# Patient Record
Sex: Male | Born: 1955 | Race: White | Hispanic: No | Marital: Married | Smoking: Former smoker
Health system: Southern US, Community
[De-identification: ages and names within clinical notes are randomized; demographics above are authoritative.]

## PROBLEM LIST (undated history)

## (undated) DIAGNOSIS — I10 Essential (primary) hypertension: Secondary | ICD-10-CM

## (undated) DIAGNOSIS — Z87442 Personal history of urinary calculi: Secondary | ICD-10-CM

## (undated) DIAGNOSIS — R51 Headache: Secondary | ICD-10-CM

## (undated) DIAGNOSIS — I2510 Atherosclerotic heart disease of native coronary artery without angina pectoris: Secondary | ICD-10-CM

## (undated) DIAGNOSIS — K219 Gastro-esophageal reflux disease without esophagitis: Secondary | ICD-10-CM

## (undated) DIAGNOSIS — R519 Headache, unspecified: Secondary | ICD-10-CM

## (undated) DIAGNOSIS — L409 Psoriasis, unspecified: Secondary | ICD-10-CM

## (undated) DIAGNOSIS — I214 Non-ST elevation (NSTEMI) myocardial infarction: Secondary | ICD-10-CM

## (undated) HISTORY — PX: FINGER SURGERY: SHX640

## (undated) HISTORY — PX: RHINOPLASTY: SUR1284

---

## 1999-10-20 ENCOUNTER — Emergency Department (HOSPITAL_COMMUNITY): Admission: EM | Admit: 1999-10-20 | Discharge: 1999-10-20 | Payer: Self-pay | Admitting: Emergency Medicine

## 2000-05-25 ENCOUNTER — Emergency Department (HOSPITAL_COMMUNITY): Admission: EM | Admit: 2000-05-25 | Discharge: 2000-05-25 | Payer: Self-pay | Admitting: Emergency Medicine

## 2007-05-26 ENCOUNTER — Emergency Department (HOSPITAL_COMMUNITY): Admission: EM | Admit: 2007-05-26 | Discharge: 2007-05-26 | Payer: Self-pay | Admitting: *Deleted

## 2011-07-14 LAB — DIFFERENTIAL
Basophils Absolute: 0.4 — ABNORMAL HIGH
Lymphs Abs: 3.1
Monocytes Absolute: 0.8 — ABNORMAL HIGH
Monocytes Relative: 6
Neutro Abs: 9.5 — ABNORMAL HIGH

## 2011-07-14 LAB — BASIC METABOLIC PANEL
CO2: 31
Chloride: 101
GFR calc non Af Amer: >60
Glucose, Bld: 122 — ABNORMAL HIGH
Potassium: 4.3
Sodium: 139

## 2011-07-14 LAB — CBC
HCT: 46.5
Hemoglobin: 16
MCHC: 34.4
MCV: 85.1
RDW: 12.9

## 2011-07-14 LAB — POCT CARDIAC MARKERS
Operator id: 3206
Troponin i, poc: <0.05

## 2014-12-09 DIAGNOSIS — I214 Non-ST elevation (NSTEMI) myocardial infarction: Secondary | ICD-10-CM

## 2014-12-09 HISTORY — DX: Non-ST elevation (NSTEMI) myocardial infarction: I21.4

## 2014-12-10 ENCOUNTER — Emergency Department (HOSPITAL_BASED_OUTPATIENT_CLINIC_OR_DEPARTMENT_OTHER): Payer: BLUE CROSS/BLUE SHIELD

## 2014-12-10 ENCOUNTER — Inpatient Hospital Stay (HOSPITAL_BASED_OUTPATIENT_CLINIC_OR_DEPARTMENT_OTHER)
Admission: EM | Admit: 2014-12-10 | Discharge: 2014-12-11 | DRG: 247 | Disposition: A | Discharge status: Home / Self Care | Payer: BLUE CROSS/BLUE SHIELD | Attending: Cardiology | Admitting: Cardiology

## 2014-12-10 ENCOUNTER — Encounter (HOSPITAL_COMMUNITY)
Admission: EM | Disposition: A | Discharge status: Home / Self Care | Payer: BLUE CROSS/BLUE SHIELD | Source: Home / Self Care | Attending: Cardiology

## 2014-12-10 ENCOUNTER — Encounter (HOSPITAL_BASED_OUTPATIENT_CLINIC_OR_DEPARTMENT_OTHER): Payer: Self-pay

## 2014-12-10 DIAGNOSIS — L409 Psoriasis, unspecified: Secondary | ICD-10-CM | POA: Diagnosis present

## 2014-12-10 DIAGNOSIS — I214 Non-ST elevation (NSTEMI) myocardial infarction: Secondary | ICD-10-CM | POA: Diagnosis present

## 2014-12-10 DIAGNOSIS — Z7902 Long term (current) use of antithrombotics/antiplatelets: Secondary | ICD-10-CM

## 2014-12-10 DIAGNOSIS — Z823 Family history of stroke: Secondary | ICD-10-CM

## 2014-12-10 DIAGNOSIS — Z87891 Personal history of nicotine dependence: Secondary | ICD-10-CM

## 2014-12-10 DIAGNOSIS — I2510 Atherosclerotic heart disease of native coronary artery without angina pectoris: Secondary | ICD-10-CM

## 2014-12-10 DIAGNOSIS — Z79899 Other long term (current) drug therapy: Secondary | ICD-10-CM | POA: Diagnosis not present

## 2014-12-10 DIAGNOSIS — Z91010 Allergy to peanuts: Secondary | ICD-10-CM | POA: Diagnosis not present

## 2014-12-10 DIAGNOSIS — I10 Essential (primary) hypertension: Secondary | ICD-10-CM | POA: Diagnosis present

## 2014-12-10 DIAGNOSIS — Z7982 Long term (current) use of aspirin: Secondary | ICD-10-CM

## 2014-12-10 DIAGNOSIS — I2582 Chronic total occlusion of coronary artery: Secondary | ICD-10-CM | POA: Diagnosis present

## 2014-12-10 DIAGNOSIS — E785 Hyperlipidemia, unspecified: Secondary | ICD-10-CM | POA: Diagnosis present

## 2014-12-10 DIAGNOSIS — R079 Chest pain, unspecified: Secondary | ICD-10-CM | POA: Diagnosis present

## 2014-12-10 DIAGNOSIS — Z955 Presence of coronary angioplasty implant and graft: Secondary | ICD-10-CM

## 2014-12-10 DIAGNOSIS — E669 Obesity, unspecified: Secondary | ICD-10-CM

## 2014-12-10 HISTORY — DX: Headache: R51

## 2014-12-10 HISTORY — DX: Gastro-esophageal reflux disease without esophagitis: K21.9

## 2014-12-10 HISTORY — DX: Essential (primary) hypertension: I10

## 2014-12-10 HISTORY — PX: PERCUTANEOUS CORONARY STENT INTERVENTION (PCI-S): SHX5485

## 2014-12-10 HISTORY — DX: Headache, unspecified: R51.9

## 2014-12-10 HISTORY — DX: Non-ST elevation (NSTEMI) myocardial infarction: I21.4

## 2014-12-10 HISTORY — DX: Psoriasis, unspecified: L40.9

## 2014-12-10 HISTORY — DX: Atherosclerotic heart disease of native coronary artery without angina pectoris: I25.10

## 2014-12-10 HISTORY — DX: Personal history of urinary calculi: Z87.442

## 2014-12-10 HISTORY — PX: LEFT HEART CATHETERIZATION WITH CORONARY ANGIOGRAM: SHX5451

## 2014-12-10 LAB — BASIC METABOLIC PANEL
ANION GAP: 6 (ref 5–15)
BUN: 18 mg/dL (ref 6–23)
CHLORIDE: 104 mmol/L (ref 96–112)
CO2: 29 mmol/L (ref 19–32)
Calcium: 8.3 mg/dL — ABNORMAL LOW (ref 8.4–10.5)
Creatinine, Ser: 1.05 mg/dL (ref 0.50–1.35)
GFR calc Af Amer: 90 mL/min — ABNORMAL LOW (ref >90)
GFR calc non Af Amer: 78 mL/min — ABNORMAL LOW (ref >90)
Glucose, Bld: 109 mg/dL — ABNORMAL HIGH (ref 70–99)
POTASSIUM: 3.8 mmol/L (ref 3.5–5.1)
Sodium: 139 mmol/L (ref 135–145)

## 2014-12-10 LAB — TROPONIN I
TROPONIN I: 0.15 ng/mL — AB (ref <0.031)
TROPONIN I: 2.45 ng/mL — AB (ref <0.031)
TROPONIN I: 7.51 ng/mL — AB (ref <0.031)
Troponin I: 1 ng/mL (ref <0.031)

## 2014-12-10 LAB — CBC WITH DIFFERENTIAL/PLATELET
Basophils Absolute: 0.1 10*3/uL (ref 0.0–0.1)
Basophils Relative: 1 % (ref 0–1)
EOS PCT: 5 % (ref 0–5)
Eosinophils Absolute: 0.6 10*3/uL (ref 0.0–0.7)
HEMATOCRIT: 42.1 % (ref 39.0–52.0)
Hemoglobin: 13.9 g/dL (ref 13.0–17.0)
Lymphocytes Relative: 19 % (ref 12–46)
Lymphs Abs: 2.2 10*3/uL (ref 0.7–4.0)
MCH: 28.2 pg (ref 26.0–34.0)
MCHC: 33 g/dL (ref 30.0–36.0)
MCV: 85.4 fL (ref 78.0–100.0)
MONOS PCT: 9 % (ref 3–12)
Monocytes Absolute: 1 10*3/uL (ref 0.1–1.0)
Neutro Abs: 7.6 10*3/uL (ref 1.7–7.7)
Neutrophils Relative %: 66 % (ref 43–77)
Platelets: 233 10*3/uL (ref 150–400)
RBC: 4.93 MIL/uL (ref 4.22–5.81)
RDW: 13.9 % (ref 11.5–15.5)
WBC: 11.4 10*3/uL — AB (ref 4.0–10.5)

## 2014-12-10 LAB — CBC
HCT: 40.3 % (ref 39.0–52.0)
HEMOGLOBIN: 13.4 g/dL (ref 13.0–17.0)
MCH: 28.4 pg (ref 26.0–34.0)
MCHC: 33.3 g/dL (ref 30.0–36.0)
MCV: 85.4 fL (ref 78.0–100.0)
PLATELETS: 244 10*3/uL (ref 150–400)
RBC: 4.72 MIL/uL (ref 4.22–5.81)
RDW: 13.7 % (ref 11.5–15.5)
WBC: 12.9 10*3/uL — ABNORMAL HIGH (ref 4.0–10.5)

## 2014-12-10 LAB — COMPREHENSIVE METABOLIC PANEL
ALBUMIN: 3.4 g/dL — AB (ref 3.5–5.2)
ALT: 17 U/L (ref 0–53)
AST: 20 U/L (ref 0–37)
Alkaline Phosphatase: 59 U/L (ref 39–117)
Anion gap: 9 (ref 5–15)
BUN: 23 mg/dL (ref 6–23)
CO2: 30 mmol/L (ref 19–32)
CREATININE: 1.19 mg/dL (ref 0.50–1.35)
Calcium: 8.1 mg/dL — ABNORMAL LOW (ref 8.4–10.5)
Chloride: 99 mmol/L (ref 96–112)
GFR calc non Af Amer: 67 mL/min — ABNORMAL LOW (ref >90)
GFR, EST AFRICAN AMERICAN: 77 mL/min — AB (ref >90)
Glucose, Bld: 145 mg/dL — ABNORMAL HIGH (ref 70–99)
Potassium: 3.4 mmol/L — ABNORMAL LOW (ref 3.5–5.1)
SODIUM: 138 mmol/L (ref 135–145)
TOTAL PROTEIN: 6.7 g/dL (ref 6.0–8.3)
Total Bilirubin: 0.4 mg/dL (ref 0.3–1.2)

## 2014-12-10 LAB — LIPID PANEL
CHOLESTEROL: 278 mg/dL — AB (ref 0–200)
HDL: 38 mg/dL — AB (ref >39)
LDL Cholesterol: 207 mg/dL — ABNORMAL HIGH (ref 0–99)
TRIGLYCERIDES: 164 mg/dL — AB (ref <150)
Total CHOL/HDL Ratio: 7.3 RATIO
VLDL: 33 mg/dL (ref 0–40)

## 2014-12-10 LAB — TSH: TSH: 4.439 u[IU]/mL (ref 0.350–4.500)

## 2014-12-10 LAB — PROTIME-INR
INR: 0.99 (ref 0.00–1.49)
Prothrombin Time: 13.2 seconds (ref 11.6–15.2)

## 2014-12-10 LAB — MAGNESIUM: Magnesium: 2.3 mg/dL (ref 1.5–2.5)

## 2014-12-10 LAB — APTT: aPTT: 45 seconds — ABNORMAL HIGH (ref 24–37)

## 2014-12-10 LAB — LIPASE, BLOOD: LIPASE: 51 U/L (ref 11–59)

## 2014-12-10 LAB — HEPARIN LEVEL (UNFRACTIONATED): Heparin Unfractionated: 0.11 IU/mL — ABNORMAL LOW (ref 0.30–0.70)

## 2014-12-10 LAB — BRAIN NATRIURETIC PEPTIDE
B NATRIURETIC PEPTIDE 5: 95.4 pg/mL (ref 0.0–100.0)
B Natriuretic Peptide: 131.9 pg/mL — ABNORMAL HIGH (ref 0.0–100.0)

## 2014-12-10 LAB — POCT ACTIVATED CLOTTING TIME: ACTIVATED CLOTTING TIME: 638 s

## 2014-12-10 LAB — D-DIMER, QUANTITATIVE: D-Dimer, Quant: 1.09 ug/mL-FEU — ABNORMAL HIGH (ref 0.00–0.48)

## 2014-12-10 SURGERY — LEFT HEART CATHETERIZATION WITH CORONARY ANGIOGRAM
Anesthesia: LOCAL

## 2014-12-10 MED ORDER — HYDRALAZINE HCL 20 MG/ML IJ SOLN
10.0000 mg | Freq: Once | INTRAMUSCULAR | Status: AC
  Administered 2014-12-10: 10 mg via INTRAVENOUS
  Filled 2014-12-10: qty 1

## 2014-12-10 MED ORDER — ASPIRIN 81 MG PO CHEW: 324.0000 mg | CHEWABLE_TABLET | ORAL | Status: DC

## 2014-12-10 MED ORDER — ACETAMINOPHEN 325 MG PO TABS
650.0000 mg | ORAL_TABLET | ORAL | Status: DC | PRN
  Administered 2014-12-11: 650 mg via ORAL
  Filled 2014-12-10: qty 2

## 2014-12-10 MED ORDER — ISOSORBIDE MONONITRATE ER 30 MG PO TB24
30.0000 mg | ORAL_TABLET | Freq: Every day | ORAL | Status: DC
  Administered 2014-12-10 – 2014-12-11 (×2): 30 mg via ORAL
  Filled 2014-12-10 (×2): qty 1

## 2014-12-10 MED ORDER — INFLUENZA VAC SPLIT QUAD 0.5 ML IM SUSY
0.5000 mL | PREFILLED_SYRINGE | INTRAMUSCULAR | Status: DC
  Filled 2014-12-10 (×2): qty 0.5

## 2014-12-10 MED ORDER — ONDANSETRON HCL 4 MG/2ML IJ SOLN: 4.0000 mg | Freq: Four times a day (QID) | INTRAMUSCULAR | Status: DC | PRN

## 2014-12-10 MED ORDER — TICAGRELOR 90 MG PO TABS
ORAL_TABLET | ORAL | Status: AC
  Filled 2014-12-10: qty 2

## 2014-12-10 MED ORDER — HEPARIN BOLUS VIA INFUSION
4000.0000 [IU] | Freq: Once | INTRAVENOUS | Status: AC
  Administered 2014-12-10: 4000 [IU] via INTRAVENOUS

## 2014-12-10 MED ORDER — ASPIRIN EC 81 MG PO TBEC: 81.0000 mg | DELAYED_RELEASE_TABLET | Freq: Every day | ORAL | Status: DC

## 2014-12-10 MED ORDER — ACETAMINOPHEN 325 MG PO TABS: 650.0000 mg | ORAL_TABLET | ORAL | Status: DC | PRN

## 2014-12-10 MED ORDER — POTASSIUM CHLORIDE CRYS ER 20 MEQ PO TBCR
40.0000 meq | EXTENDED_RELEASE_TABLET | Freq: Once | ORAL | Status: AC
  Administered 2014-12-10: 40 meq via ORAL
  Filled 2014-12-10 (×2): qty 2

## 2014-12-10 MED ORDER — HEPARIN (PORCINE) IN NACL 100-0.45 UNIT/ML-% IJ SOLN
INTRAMUSCULAR | Status: AC
  Administered 2014-12-10: 25000 [IU] via INTRAVENOUS
  Filled 2014-12-10: qty 250

## 2014-12-10 MED ORDER — LIDOCAINE HCL (PF) 1 % IJ SOLN
INTRAMUSCULAR | Status: AC
  Filled 2014-12-10: qty 30

## 2014-12-10 MED ORDER — TICAGRELOR 90 MG PO TABS
90.0000 mg | ORAL_TABLET | Freq: Two times a day (BID) | ORAL | Status: DC
  Administered 2014-12-11 (×2): 90 mg via ORAL
  Filled 2014-12-10 (×3): qty 1

## 2014-12-10 MED ORDER — ATORVASTATIN CALCIUM 80 MG PO TABS
80.0000 mg | ORAL_TABLET | Freq: Every day | ORAL | Status: DC
  Administered 2014-12-10: 80 mg via ORAL
  Filled 2014-12-10 (×4): qty 1

## 2014-12-10 MED ORDER — METOPROLOL TARTRATE 25 MG PO TABS: 25.0000 mg | ORAL_TABLET | Freq: Two times a day (BID) | ORAL | Status: DC

## 2014-12-10 MED ORDER — HEPARIN SODIUM (PORCINE) 1000 UNIT/ML IJ SOLN
INTRAMUSCULAR | Status: AC
  Filled 2014-12-10: qty 1

## 2014-12-10 MED ORDER — METOPROLOL TARTRATE 12.5 MG HALF TABLET
12.5000 mg | ORAL_TABLET | Freq: Two times a day (BID) | ORAL | Status: DC
  Filled 2014-12-10 (×2): qty 1

## 2014-12-10 MED ORDER — ZOLPIDEM TARTRATE 5 MG PO TABS
5.0000 mg | ORAL_TABLET | Freq: Every evening | ORAL | Status: DC | PRN
  Administered 2014-12-10: 5 mg via ORAL
  Filled 2014-12-10: qty 1

## 2014-12-10 MED ORDER — NITROGLYCERIN 0.4 MG SL SUBL: 0.4000 mg | SUBLINGUAL_TABLET | SUBLINGUAL | Status: DC | PRN

## 2014-12-10 MED ORDER — ATORVASTATIN CALCIUM 80 MG PO TABS: 80.0000 mg | ORAL_TABLET | Freq: Every day | ORAL | Status: DC

## 2014-12-10 MED ORDER — ASPIRIN EC 81 MG PO TBEC
81.0000 mg | DELAYED_RELEASE_TABLET | Freq: Every day | ORAL | Status: DC
  Administered 2014-12-11: 81 mg via ORAL
  Filled 2014-12-10: qty 1

## 2014-12-10 MED ORDER — NITROGLYCERIN IN D5W 200-5 MCG/ML-% IV SOLN
INTRAVENOUS | Status: AC
  Filled 2014-12-10: qty 250

## 2014-12-10 MED ORDER — FENTANYL CITRATE 0.05 MG/ML IJ SOLN
INTRAMUSCULAR | Status: AC
  Filled 2014-12-10: qty 2

## 2014-12-10 MED ORDER — SODIUM CHLORIDE 0.9 % IV SOLN
Freq: Once | INTRAVENOUS | Status: AC
  Administered 2014-12-10: 10 mL/h via INTRAVENOUS

## 2014-12-10 MED ORDER — HEPARIN (PORCINE) IN NACL 2-0.9 UNIT/ML-% IJ SOLN
INTRAMUSCULAR | Status: AC
  Filled 2014-12-10: qty 1000

## 2014-12-10 MED ORDER — VERAPAMIL HCL 2.5 MG/ML IV SOLN
INTRAVENOUS | Status: AC
  Filled 2014-12-10: qty 2

## 2014-12-10 MED ORDER — SODIUM CHLORIDE 0.9 % IV SOLN
0.2500 mg/kg/h | INTRAVENOUS | Status: DC
  Filled 2014-12-10: qty 250

## 2014-12-10 MED ORDER — ASPIRIN 81 MG PO CHEW
324.0000 mg | CHEWABLE_TABLET | Freq: Once | ORAL | Status: AC
  Administered 2014-12-10: 324 mg via ORAL
  Filled 2014-12-10: qty 4

## 2014-12-10 MED ORDER — MIDAZOLAM HCL 2 MG/2ML IJ SOLN
INTRAMUSCULAR | Status: AC
  Filled 2014-12-10: qty 2

## 2014-12-10 MED ORDER — METOPROLOL TARTRATE 25 MG PO TABS
25.0000 mg | ORAL_TABLET | Freq: Two times a day (BID) | ORAL | Status: DC
Start: 2014-12-10 — End: 2014-12-11
  Administered 2014-12-10 – 2014-12-11 (×3): 25 mg via ORAL
  Filled 2014-12-10 (×4): qty 1

## 2014-12-10 MED ORDER — SODIUM CHLORIDE 0.9 % IV SOLN: INTRAVENOUS | Status: DC

## 2014-12-10 MED ORDER — NITROGLYCERIN 0.4 MG SL SUBL
0.4000 mg | SUBLINGUAL_TABLET | SUBLINGUAL | Status: DC | PRN
  Administered 2014-12-10 (×3): 0.4 mg via SUBLINGUAL

## 2014-12-10 MED ORDER — NITROGLYCERIN 1 MG/10 ML FOR IR/CATH LAB
INTRA_ARTERIAL | Status: AC
  Filled 2014-12-10: qty 10

## 2014-12-10 MED ORDER — BIVALIRUDIN 250 MG IV SOLR
INTRAVENOUS | Status: AC
  Filled 2014-12-10: qty 250

## 2014-12-10 MED ORDER — ASPIRIN 300 MG RE SUPP: 300.0000 mg | RECTAL | Status: DC

## 2014-12-10 MED ORDER — HEPARIN (PORCINE) IN NACL 100-0.45 UNIT/ML-% IJ SOLN
1400.0000 [IU]/h | INTRAMUSCULAR | Status: DC
  Administered 2014-12-10: 25000 [IU] via INTRAVENOUS
  Filled 2014-12-10 (×2): qty 250

## 2014-12-10 NOTE — Progress Notes (Signed)
Unable to titrate patient off nitroglycerin infusion after imdur given. Dr. Tresa EndoKelly notified, patients sbp 180's on 10 mcg IV nitroglycerin. Instructed to keep on nitroglycerin infusion and titrate to systolic blood pressure, 100-140's ideally. Give metoprolol 2200 dose now. Further evaluation of blood pressure management can be assessed in the morning.

## 2014-12-10 NOTE — Interval H&P Note (Signed)
Cath Lab Visit (complete for each Cath Lab visit)  Clinical Evaluation Leading to the Procedure:   ACS: Yes.    Non-ACS:    Anginal Classification: CCS III  Anti-ischemic medical therapy: Minimal Therapy (1 class of medications)  Non-Invasive Test Results: No non-invasive testing performed  Prior CABG: No previous CABG      History and Physical Interval Note:  12/10/2014 11:57 AM  Elmon ElseStephen Sepulveda  has presented today for surgery, with the diagnosis of cp  The various methods of treatment have been discussed with the patient and family. After consideration of risks, benefits and other options for treatment, the patient has consented to  Procedure(s): LEFT HEART CATHETERIZATION WITH CORONARY ANGIOGRAM (N/A) as a surgical intervention .  The patient's history has been reviewed, patient examined, no change in status, stable for surgery.  I have reviewed the patient's chart and labs.  Questions were answered to the patient's satisfaction.     Tammra Pressman A

## 2014-12-10 NOTE — Progress Notes (Signed)
CRITICAL VALUE ALERT  Critical value received:yes  Date of notification: 12/10/14  Time of notification0819  Critical value read back: yes  Nurse who received alert: Wess Bottsesther Robley Matassa  MD notified (1st page):  Samara DeistKathryn, [place]  Time of first page: 0900  MD notified (2nd page):  Time of second page:  Responding MD: Danice GoltzKathryn T, PA  Time MD responded: 0900

## 2014-12-10 NOTE — CV Procedure (Addendum)
Matthew Walls is a 59 y.o. male   [id number]  [id number] LOCATION:  FACILITY: [place]  PHYSICIAN: Troy Sine, MD, [facility] 1956/08/30   DATE OF PROCEDURE:  12/10/2014     CARDIAC CATHETERIZATION/PERCUTANEOUS CORONARY INTERVENTION    HISTORY:    Mr. Matthew Walls is a 59 year old male without known prior CAD.  He has developed several weeks of chest discomfort.  Last evening he developed significant chest discomfort which did not improve.  He presented to the emergency room in the middle of the night.  Troponins are mildly positive suggestive of a NSTEMI.  He is brought to the catheterization laboratory for cardiac catheterization and possible percutaneous coronary intervention.   PROCEDURE:  Left heart catheterization via the radial approach: Coronary angiography, left ventriculography; PCI of totally occluded left circumflex coronary artery involving the AV groove circumflex and OM1 vessel  The patient was brought to the second floor [facility] cath lab in the fasting state. The patient was premedicated with Versed 2 mg and fentanyl 50 mcg. A right radial approach was utilized after an Allen's test verified adequate circulation. The right radial artery was punctured via the Seldinger technique, and a 6 [place] Glidesheath Slender was inserted without difficulty.  A radial cocktail consisting of Verapamil, IV nitroglycerin, and lidocaine was administered. Weight adjusted heparin was administered. A safety J wire was advanced into the ascending aorta. Diagnostic catheterization was done with a 5 [place] TIG 4.0 catheter. A 5 French pigtail catheter was used for left ventriculography.  With the demonstration of totally occluded left circumflex coronary artery in the AV groove with faint antegrade filling distally after a several centimeters of occlusion.  A decision was made to attempt a percutaneous coronary intervention.  Bivalirudin bolus plus infusion was administered.  Brilinta 180 mg was  administered orally.  A 6 French XB 3.5 guide with sideholes was needed for the intervention due to significant catheter dampening in the circumflex vessel without sideholes.  A Fielder XT wire was able to cross the total occlusion and advanced into the obtuse marginal vessel.  Once the total occlusion was opened, there was tandem stenoses in the obtuse marginal vessel.  Initial dilatation was done with a 2.015 mm Euphora balloon at 3, 6, 10, and 10 atm in the AV groove circumflex and tandem sites in the obtuse marginal vessel.  This was then upgraded to a 2.515 mm Euphora balloon and 3 inflations at 7, 9, and 10 atm were made.  A Resolute integrity 3.034 mm DES stent was then inserted to cover all 3 lesions commencing in the AV groove circumflex and extending into the mid obtuse marginal branch.  This was deployed at 12 and 13 atm.  An [place] Euphora 3.2515 mm balloon was used for post stent dilatation up to 3.23 mm.  Angiography confirmed an excellent angiographic result with brisk TIMI-3 TIMI-3 flow supplying a moderate-sized distal circumflex bed.  All catheters were removed from the patient.  A TR radial band was applied for hemostasis. The patient left the catheterization laboratory in stable condition.   HEMODYNAMICS:   Central Aorta: 160/102  Left Ventricle: 160/24  ANGIOGRAPHY:   The left main coronary artery was a very short vessel which immediately bifurcated into the LAD and left circumflex vessel  The LAD was a moderate size vessel that gave rise to a large first diagonal and first septal vessel.  There was mild 30% narrowing in the LAD after this large septal perforating artery.  The LAD extended to  the apex and was otherwise free of significant disease.  The left circumflex coronary artery had 30% ostial narrowing followed by 40-50% proximal stenosis.  There was any 70% stenosis before a diminutive branch.  The vessel was then occluded in the AV groove.  There was a skip leshion and  beyond this was faint filling with < 1TIMI flow of an obtuse marginal and distal AV groove circumflex.  The RCA was a moderate size vessel that a 20% proximal narrowing.  There are tandem 50 and 60% stenoses in the mid RCA.  There was 30% narrowing beyond the acute margin and 60% stenosis in the distal RCA extending into the PDA at the arch and of the continuation branch.  This was smooth and eccentric.  There was very faint collaterals to very small distal vessels of the circumflex.  Left ventriculography revealed low normal LV function with an ejection fraction of 50%. There was mild mid inferior hypocontractility.  Following PCI of the totally occluded AV groove circumflex with segmental lesions into the obtuse marginal vessel, treated with PTCA, and ultimate DES stenting with a Resolute 3.034 mm stent postdilated to 3.23 mm, the 70% stenosis was reduced to 0%, 100% occlusion to 0% and a 70% stenoses in the obtuse marginal vessel reduced to 0%.  Beyond the stent, the vessel tapered and gave rise to 3 additional branches.    IMPRESSION:  Non-ST segment elevation myocardial infarction secondary to total occlusion of the left circumflex vessel.  Three-vessel CAD with smooth 30% narrowing in the LAD, 30% ostial, 40-50% proximal followed by 70% AV groove circumflex stenosis prior to being totally occluded, and moderate RCA stenoses of 50-60% in the mid segment, 30% beyond the acute margin and 60% in the region of the PDA takeoff.  Successful percutaneous cardiac intervention of the left circumflex coronary artery with PTCA and DES stenting of the AV groove circumflex, and several sites in the obtuse 1 marginal vessel with insertion of a 3.034 mm Resolute DES stent postdilated 3.23 mm with the entire stented region reduced to 0%.  There is resumption of brisk TIMI-3 flow.   RECOMMENDATION:  Medical therapy for concomitant CAD.  Patient will continue with dual antiplatelet therapy for minimum of a  year and may benefit from long-term treatment beyond a year based on Pegasys trial data at areduce Brilinta dose.  Aggressive statin therapy will be implemented.   Troy Sine, MD, [facility] 12/10/2014 2:13 PM

## 2014-12-10 NOTE — ED Notes (Signed)
Attempted report 

## 2014-12-10 NOTE — ED Notes (Signed)
[facility], [facility] discussed, pt agreeable. Denies pain at this time, VSS/improved.

## 2014-12-10 NOTE — ED Provider Notes (Signed)
CSN: [id number]     Arrival date & time 12/10/14  0104 History   First MD Initiated Contact with Patient 12/10/14 0155     Chief Complaint  Patient presents with  . Chest Pain     (Consider location/radiation/quality/duration/timing/severity/associated sxs/prior Treatment) HPI Comments: Patient is a 59 year old male with no significant past medical history. He presents for evaluation of chest discomfort he has been experiencing intermittently for the past 2 weeks. He states it is worse when he lays back and after he eats. He denies any shortness of breath, nausea, diaphoresis, or radiation to the arm or jaw. This evening, his wife took his blood pressure and it was markedly elevated and he comes for evaluation of this.  Patient takes no medications and reports to me he has not seen a physician in over 5 years. He was followed by a physician at [facility], however she has since relocated and the patient has no primary.  Patient is a 59 y.o. male presenting with chest pain. The history is provided by the patient.  Chest Pain Pain location:  Substernal area Pain quality: tightness   Pain radiates to:  Does not radiate Pain severity:  Moderate Onset quality:  Sudden Duration:  2 weeks Timing:  Intermittent Progression:  Worsening Chronicity:  New Relieved by:  Rest Worsened by:  Certain positions and movement   Past Medical History  Diagnosis Date  . Psoriasis    History reviewed. No pertinent past surgical history. No family history on file. History  Substance Use Topics  . Smoking status: Never Smoker   . Smokeless tobacco: Not on file  . Alcohol Use: No    Review of Systems  Cardiovascular: Positive for chest pain.  All other systems reviewed and are negative.     Allergies  Review of patient's allergies indicates no known allergies.  Home Medications   Prior to Admission medications   Not on File   BP 185/106 mmHg  Pulse 115  Temp(Src) 98.5 F  (36.9 C) (Oral)  Resp 16  Ht  (1.753 m)  Wt 260 lb (117.935 kg)  BMI 38.38 kg/m2  SpO2 94% Physical Exam  Constitutional: He is oriented to person, place, and time. He appears well-developed and well-nourished. No distress.  HENT:  Head: Normocephalic and atraumatic.  Mouth/Throat: Oropharynx is clear and moist.  Neck: Normal range of motion. Neck supple.  Cardiovascular: Normal rate, regular rhythm and normal heart sounds.   No murmur heard. Pulmonary/Chest: Effort normal and breath sounds normal. No respiratory distress. He has no wheezes.  Abdominal: Soft. Bowel sounds are normal. He exhibits no distension. There is no tenderness.  Musculoskeletal: Normal range of motion. He exhibits edema.  There is 1+ edema of the bilateral lower extremities.  Neurological: He is alert and oriented to person, place, and time.  Skin: He is not diaphoretic.  Patient has a generalized psoriatic rash.  Nursing note and vitals reviewed.   ED Course  Procedures (including critical care time) Labs Review Labs Reviewed  CBC WITH DIFFERENTIAL/PLATELET - Abnormal; Notable for the following:    WBC 11.4 (*)    All other components within normal limits  COMPREHENSIVE METABOLIC PANEL  LIPASE, BLOOD  BRAIN NATRIURETIC PEPTIDE  TROPONIN I    Imaging Review Dg Chest 2 View  12/10/2014   CLINICAL DATA:  Chest pain and pressure for 2 weeks. Shortness of breath. Nonsmoker. History hypertension.  EXAM: CHEST  2 VIEW  COMPARISON:  05/26/2007  FINDINGS: Cardiac  enlargement. Mild to pulmonary vascular congestion. Interstitial changes in the lungs likely to represent interstitial edema. No focal consolidation. No blunting of costophrenic angles. No pneumothorax. Tortuous aorta.  IMPRESSION: Cardiac enlargement with pulmonary vascular congestion and interstitial edema.   Electronically Signed   By: Burman NievesWilliam  Stevens M.D.   On: 12/10/2014 01:33     EKG Interpretation   Date/Time:  Thursday December 10 2014  01:19:25 EST Ventricular Rate:  113 PR Interval:  134 QRS Duration: 86 QT Interval:  344 QTC Calculation: 471 R Axis:   48 Text Interpretation:  Sinus tachycardia Left ventricular hypertrophy with  repolarization abnormality Abnormal ECG Confirmed by DELOS  MD, Jasher Barkan  ([id number]) on 12/10/2014 2:09:14 AM      MDM   Final diagnoses:  None    Patient presents with complaints of tightness in his chest that has come and gone over the past 2 weeks. This occurs sometimes with exertion, sometimes not. His discomfort this evening was relieved with nitroglycerin. Workup reveals LVH with repolarization abnormality on his EKG along with a troponin of 0.15. I have high suspicion for a cardiac etiology and have discussed this with Dr. Leeann MustJacob Kelly who was on-call for cardiology. He agrees to accept the patient in transfer. He has recommended starting heparin and admission to telemetry.  CRITICAL CARE Performed by: Geoffery LyonseLo, Santhosh Gulino Total critical care time: 30 minutes Critical care time was exclusive of separately billable procedures and treating other patients. Critical care was necessary to treat or prevent imminent or life-threatening deterioration. Critical care was time spent personally by me on the following activities: development of treatment plan with patient and/or surrogate as well as nursing, discussions with consultants, evaluation of patient's response to treatment, examination of patient, obtaining history from patient or surrogate, ordering and performing treatments and interventions, ordering and review of laboratory studies, ordering and review of radiographic studies, pulse oximetry and re-evaluation of patient's condition.     Geoffery Lyonsouglas Brooke Payes, MD 12/10/14 [phone]

## 2014-12-10 NOTE — Progress Notes (Signed)
Patient Name: Matthew Walls Date of Encounter: 12/10/2014     Principal Problem:   NSTEMI (non-ST elevated myocardial infarction) Active Problems:   Chest pain    SUBJECTIVE  No CP or SOB currently. Understands that he may need a LHC today.   CURRENT MEDS . [START ON 12/11/2014] aspirin EC  81 mg Oral Daily  . atorvastatin  80 mg Oral q1800  . metoprolol tartrate  12.5 mg Oral BID    OBJECTIVE  Filed Vitals:   12/10/14 0315 12/10/14 0330 12/10/14 0345 12/10/14 0500  BP: 168/93 177/102 166/87 195/97  Pulse: 101 101 97 98  Temp:    97.7 F (36.5 C)  TempSrc:    Oral  Resp: Height:     (1.753 m)  Weight:    285 lb 0.9 oz (129.3 kg)  SpO2: 96% 96% 98% 100%   No intake or output data in the 24 hours ending 12/10/14 0750 Filed Weights   12/10/14 0114 12/10/14 0500  Weight: 260 lb (117.935 kg) 285 lb 0.9 oz (129.3 kg)    PHYSICAL EXAM  General: Pleasant, NAD. obese Neuro: Alert and oriented X 3. Moves all extremities spontaneously. Psych: Normal affect. HEENT:  Normal  Neck: Supple without bruits or JVD. Lungs:  Resp regular and unlabored, CTA. Heart: tachy. RRR no s3, s4, or murmurs. Abdomen: Soft, non-tender, non-distended, BS + x 4.  Extremities: No clubbing, cyanosis or edema. DP/PT/Radials 2+ and equal bilaterally.  Accessory Clinical Findings  CBC  Recent Labs  12/10/14 0156 12/10/14 0618  WBC 11.4* 12.9*  NEUTROABS 7.6  --   HGB 13.9 13.4  HCT 42.1 40.3  MCV 85.4 85.4  PLT 233 244   Basic Metabolic Panel  Recent Labs  12/10/14 0156  NA 138  K 3.4*  CL 99  CO2 30  GLUCOSE 145*  BUN 23  CREATININE 1.19  CALCIUM 8.1*   Liver Function Tests  Recent Labs  12/10/14 0156  AST 20  ALT 17  ALKPHOS 59  BILITOT 0.4  PROT 6.7  ALBUMIN 3.4*    Recent Labs  12/10/14 0156  LIPASE 51   Cardiac Enzymes  Recent Labs  12/10/14 0156  TROPONINI 0.15*    TELE  Sinus tachycardia.  Radiology/Studies  Dg Chest  2 View  12/10/2014   CLINICAL DATA:  Chest pain and pressure for 2 weeks. Shortness of breath. Nonsmoker. History hypertension.  EXAM: CHEST  2 VIEW  COMPARISON:  05/26/2007  FINDINGS: Cardiac enlargement. Mild to pulmonary vascular congestion. Interstitial changes in the lungs likely to represent interstitial edema. No focal consolidation. No blunting of costophrenic angles. No pneumothorax. Tortuous aorta.  IMPRESSION: Cardiac enlargement with pulmonary vascular congestion and interstitial edema.   Electronically Signed   By: Burman Nieves M.D.   On: 12/10/2014 01:33    ASSESSMENT AND PLAN  Mr. Tanvir Hipple is a 59 yo man with no past medical hx except psoriasis and obesity who presented to [facility] ED last night with intermittent chest pain that improved with SL NTG and found to have elevated troponin. He denies hx of HLD, DM or HTN and takes no medications. He hasn't seen a medical doctor in over 1 year and doesn't regularly follow with a PCP.  NSTEMI- troponin 0.15. ECG with no acute ST or TW changes. Continue to cycle enzymes.  -- No chest pain currently but his symptoms are very typical of ACS. Exertional and relieved by SL NTG.  --  Continue heparin gtt. Will plan for LHC today. Remain NPO -- Continue ASA, statin and BB  HLD- LDL 209. Placed on high dose statin.   Hyperglycemia- HgA1c pending  HTN- he has been very hypertensive since admission. Will placed on metoprolol 25mg  BID. This will help with tachycardia as well.   Sinus tachycardia- with sinus tach HR 98-120. Will add BB as above.  -- His father has a hx of DVT/PE. Patient is not SOB or had any recent travel, but will order a DDimer.   Obesity- diet and exercise.   Billy FischerSigned, THOMPSON, KATHRYN R PA-C  Pager [phone]  Personally seen and examined. Agree with above. NSTEMI - cath. Risks and benefits (stroke, MI , death, bleeding, renal) reviewed. Anxious. Radial. Willing to proceed. Increase metoprolol as needed. HTN noted.  ACE-I next.   Donato SchultzSKAINS, MARK, MD

## 2014-12-10 NOTE — Care Management Note (Addendum)
    Page 1 of 1   12/11/2014     11:25:53 AM CARE MANAGEMENT NOTE 12/11/2014  Patient:  Matthew Walls,Matthew Walls   Account Number:  [id number]  Date Initiated:  12/10/2014  Documentation initiated by:  Junius CreamerWELL,DEBBIE  Subjective/Objective Assessment:   adm w nstemi     Action/Plan:   lives w wife   Anticipated DC Date:  12/11/2014   Anticipated DC Plan:  HOME/SELF CARE      DC Planning Services  CM consult  PCP issues      Choice offered to / List presented to:             Status of service:   Medicare Important Message given?  NO (If response is "NO", the following Medicare IM given date fields will be blank) Date Medicare IM given:   Medicare IM given by:   Date Additional Medicare IM given:   Additional Medicare IM given by:    Discharge Disposition:  HOME/SELF CARE  Per UR Regulation:  Reviewed for med. necessity/level of care/duration of stay  If discussed at Long Length of Stay Meetings, dates discussed:    Comments:  12/11/14 Sidney AceJulie Akeen Ledyard, RN, BSN [phone] Pt s/p PCI; will dc on Brilinta.  Brilinta booklet given with 30 day free trial card and copay card.  per bcbs  online:  brilinta is a tier 3/ $50.00 co-pay at retail/ no auth required  patient can use: [facility], [facility], [facility], [facility], [facility], [facility], [facility], [facility], [facility]  Pt able to use copay card, and will be able to get med for $18/month.  Pt also needs PCP...states [facility] [place].  Appt made for Tues, March, 15 at 9:00am. Appt info put on AVS, and pt instructed on appt.

## 2014-12-10 NOTE — H&P (Signed)
Matthew Walls is an 59 y.o. male.    Chief Complaint: chest pain  Primary Cardiologist: new HPI: Matthew Walls is a 59 yo man with no PMH who present with intermittent chest pressure over the past two weeks walking up stairs. He characterizes the pain as pressure sensation, worse with exertion up stairs with some associated shortness of breath and sometimes worse laying back after eating. Tonight the pain came on again and it wasn't relieved leading to presentation. He received SL NTG with resolution of symptoms, troponin elevated and heparin initiated for NSTEMI leading to transfer to [facility]. No tobacco use.     Past Medical History  Diagnosis Date  . Psoriasis     History reviewed. No pertinent past surgical history.  No family history on file. Social History:  reports that he has never smoked. He does not have any smokeless tobacco history on file. He reports that he does not drink alcohol. His drug history is not on file. Family history of mother with CVA in 84s; father with unknown blood clots Allergies: No Known Allergies   (Not in a hospital admission)  Results for orders placed or performed during the hospital encounter of 12/10/14 (from the past 48 hour(s))  CBC with Differential     Status: Abnormal   Collection Time: 12/10/14  1:56 AM  Result Value Ref Range   WBC 11.4 (H) 4.0 - 10.5 K/uL   RBC 4.93 4.22 - 5.81 MIL/uL   Hemoglobin 13.9 13.0 - 17.0 g/dL   HCT 42.1 39.0 - 52.0 %   MCV 85.4 78.0 - 100.0 fL   MCH 28.2 26.0 - 34.0 pg   MCHC 33.0 30.0 - 36.0 g/dL   RDW 13.9 11.5 - 15.5 %   Platelets 233 150 - 400 K/uL   Neutrophils Relative % 66 43 - 77 %   Neutro Abs 7.6 1.7 - 7.7 K/uL   Lymphocytes Relative 19 12 - 46 %   Lymphs Abs 2.2 0.7 - 4.0 K/uL   Monocytes Relative 9 3 - 12 %   Monocytes Absolute 1.0 0.1 - 1.0 K/uL   Eosinophils Relative 5 0 - 5 %   Eosinophils Absolute 0.6 0.0 - 0.7 K/uL   Basophils Relative 1 0 - 1 %   Basophils Absolute 0.1 0.0 -  0.1 K/uL  Comprehensive metabolic panel     Status: Abnormal   Collection Time: 12/10/14  1:56 AM  Result Value Ref Range   Sodium 138 135 - 145 mmol/L   Potassium 3.4 (L) 3.5 - 5.1 mmol/L   Chloride 99 96 - 112 mmol/L   CO2 30 19 - 32 mmol/L   Glucose, Bld 145 (H) 70 - 99 mg/dL   BUN 23 6 - 23 mg/dL   Creatinine, Ser 1.19 0.50 - 1.35 mg/dL   Calcium 8.1 (L) 8.4 - 10.5 mg/dL   Total Protein 6.7 6.0 - 8.3 g/dL   Albumin 3.4 (L) 3.5 - 5.2 g/dL   AST 20 0 - 37 U/L   ALT 17 0 - 53 U/L   Alkaline Phosphatase 59 39 - 117 U/L   Total Bilirubin 0.4 0.3 - 1.2 mg/dL   GFR calc non Af Amer 67 (L) >90 mL/min   GFR calc Af Amer 77 (L) >90 mL/min    Comment: (NOTE) The eGFR has been calculated using the CKD EPI equation. This calculation has not been validated in all clinical situations. eGFR's persistently <90 mL/min signify possible Chronic Kidney Disease.  Anion gap 9 5 - 15  Lipase, blood     Status: None   Collection Time: 12/10/14  1:56 AM  Result Value Ref Range   Lipase 51 11 - 59 U/L  Brain natriuretic peptide     Status: None   Collection Time: 12/10/14  1:56 AM  Result Value Ref Range   B Natriuretic Peptide 95.4 0.0 - 100.0 pg/mL  Troponin I     Status: Abnormal   Collection Time: 12/10/14  1:56 AM  Result Value Ref Range   Troponin I 0.15 (H) <0.031 ng/mL    Comment:        PERSISTENTLY INCREASED TROPONIN VALUES IN THE RANGE OF 0.04-0.49 ng/mL CAN BE SEEN IN:       -UNSTABLE ANGINA       -CONGESTIVE HEART FAILURE       -MYOCARDITIS       -CHEST TRAUMA       -ARRYHTHMIAS       -LATE PRESENTING MYOCARDIAL INFARCTION       -COPD   CLINICAL FOLLOW-UP RECOMMENDED.    Dg Chest 2 View  12/10/2014   CLINICAL DATA:  Chest pain and pressure for 2 weeks. Shortness of breath. Nonsmoker. History hypertension.  EXAM: CHEST  2 VIEW  COMPARISON:  05/26/2007  FINDINGS: Cardiac enlargement. Mild to pulmonary vascular congestion. Interstitial changes in the lungs likely to  represent interstitial edema. No focal consolidation. No blunting of costophrenic angles. No pneumothorax. Tortuous aorta.  IMPRESSION: Cardiac enlargement with pulmonary vascular congestion and interstitial edema.   Electronically Signed   By: Lucienne Capers M.D.   On: 12/10/2014 01:33    Review of Systems  Constitutional: Negative for fever, chills and malaise/fatigue.  HENT: Negative for tinnitus.   Eyes: Negative for blurred vision, double vision and photophobia.  Respiratory: Positive for shortness of breath. Negative for sputum production.   Cardiovascular: Positive for chest pain. Negative for palpitations, claudication and leg swelling.  Gastrointestinal: Positive for heartburn. Negative for nausea, vomiting and abdominal pain.  Genitourinary: Negative for dysuria and urgency.  Musculoskeletal: Negative for myalgias and neck pain.  Neurological: Negative for dizziness, tingling, tremors and headaches.  Endo/Heme/Allergies: Negative for environmental allergies. Does not bruise/bleed easily.  Psychiatric/Behavioral: Negative for depression, suicidal ideas, hallucinations and substance abuse.    Blood pressure 166/87, pulse 97, temperature 98.5 F (36.9 C), temperature source Oral, resp. rate 20, height _0  (1.753 m), weight 117.935 kg (260 lb), SpO2 98 %. Physical Exam  Nursing note and vitals reviewed. Constitutional: He is oriented to person, place, and time. He appears well-developed and well-nourished. No distress.  HENT:  Head: Normocephalic and atraumatic.  Nose: Nose normal.  Mouth/Throat: Oropharynx is clear and moist. No oropharyngeal exudate.  Eyes: Conjunctivae and EOM are normal. Pupils are equal, round, and reactive to light. No scleral icterus.  Neck: Normal range of motion. Neck supple. No JVD present. No tracheal deviation present.  Cardiovascular: Normal rate, regular rhythm, normal heart sounds and intact distal pulses.  Exam reveals no gallop.   No murmur  heard. Respiratory: Effort normal and breath sounds normal. No respiratory distress. He has no wheezes. He has no rales.  GI: Soft. Bowel sounds are normal. He exhibits no distension. There is no tenderness. There is no rebound.  Musculoskeletal: Normal range of motion. He exhibits no edema or tenderness.  Neurological: He is alert and oriented to person, place, and time. No cranial nerve deficit. Coordination normal.  Skin: Skin is warm and  dry. No rash noted. He is not diaphoretic. No erythema.  Psychiatric: He has a normal mood and affect. His behavior is normal. Thought content normal.    Labs reviewed; trop 0.15, bun/cr 23/1.2, na 138, K 3.4, wbc 11, h/h 13/42, plt 233 EKG: sinus tachycardia, LVH with repolarization  Chest x-ray: mildly enlarged cardiac silhouette, ? Interstitial edema  Assessment/Plan Problem List Chest Pain Elevated Troponin/NSTEMI Hypertension  Matthew Walls is a 59 yo man with no PMH who presents with intermittent chest pain that improved with SL NTG and found to have elevated troponin. Differential diagnosis is musculoskeletal pain, esophageal spasm, GERD, pericarditis, ACS/NSTEMI among other etiologies. I favor a diagnosis of NSTEMI with plans for likely LHC in AM.  - NPO after MN - trend cardiac markers, heparin gtt - observation on telemetry - asa 81 mg, metoprolol 12.5 mg bid, atorvastatin 80 mg qHS now - hba1c, tsh, lipid panel, BNP    Landa Mullinax 12/10/2014, 4:28 AM

## 2014-12-10 NOTE — Progress Notes (Signed)
At 0500 BP noted to be 195/97 on dinamap. MD at bedside and gave order for 10 mg hydralazine once IV.   Pt resting comfortably and in no distress.   Will continue to monitor closely .  Matthew RoysMcGrath, Ellenora Talton R

## 2014-12-10 NOTE — ED Notes (Signed)
Pt c/o center chest pain since 2030 tonight, took mylanta with relief; states hx of same for over 2wks but only when laying down and goes away, this feels the same but won't go away; b/p elevated 220/120, no hx or no meds; denies SOB/nausea/radiating of pain/diaphoresis; no distress

## 2014-12-10 NOTE — ED Notes (Signed)
[facility] No changes. VSS/improved. Denies pain.

## 2014-12-10 NOTE — Progress Notes (Signed)
ANTICOAGULATION CONSULT NOTE - Initial Consult  Pharmacy Consult for Heparin Indication: chest pain/ACS  No Known Allergies  Patient Measurements: Height: 5\' 9"  (175.3 cm) Weight: 260 lb (117.935 kg) IBW/kg (Calculated) : 70.7 Heparin Dosing Weight: 95 kg  Vital Signs: Temp: 98.5 F (36.9 C) (03/10 0114) Temp Source: Oral (03/10 0114) BP: 166/87 mmHg (03/10 0345) Pulse Rate: 97 (03/10 0345)  Labs:  Recent Labs  12/10/14 0156  HGB 13.9  HCT 42.1  PLT 233  CREATININE 1.19  TROPONINI 0.15*    Estimated Creatinine Clearance: 87.8 mL/min (by C-G formula based on Cr of 1.19).   Medical History: Past Medical History  Diagnosis Date  . Psoriasis    Assessment: 59 y.o. male with chest pain for heparin   Goal of Therapy:  Heparin level 0.3-0.7 units/ml Monitor platelets by anticoagulation protocol: Yes   Plan:  Heparin 4000 units IV bolus, then start heparin 1400 units/hr Check heparin level in 6 hours.  Eddie CandleAbbott, Lennyn Gange Vernon 12/10/2014,4:33 AM

## 2014-12-10 NOTE — ED Notes (Signed)
Pt to xray, steady gait

## 2014-12-10 NOTE — ED Notes (Signed)
Dr. Delo at [facility].  

## 2014-12-11 ENCOUNTER — Encounter (HOSPITAL_COMMUNITY): Payer: Self-pay | Admitting: Physician Assistant

## 2014-12-11 DIAGNOSIS — I10 Essential (primary) hypertension: Secondary | ICD-10-CM

## 2014-12-11 DIAGNOSIS — E663 Overweight: Secondary | ICD-10-CM

## 2014-12-11 DIAGNOSIS — I2510 Atherosclerotic heart disease of native coronary artery without angina pectoris: Secondary | ICD-10-CM

## 2014-12-11 DIAGNOSIS — E785 Hyperlipidemia, unspecified: Secondary | ICD-10-CM

## 2014-12-11 LAB — BASIC METABOLIC PANEL
ANION GAP: 6 (ref 5–15)
BUN: 12 mg/dL (ref 6–23)
CALCIUM: 8.3 mg/dL — AB (ref 8.4–10.5)
CO2: 26 mmol/L (ref 19–32)
Chloride: 107 mmol/L (ref 96–112)
Creatinine, Ser: 1.12 mg/dL (ref 0.50–1.35)
GFR calc Af Amer: 83 mL/min — ABNORMAL LOW (ref >90)
GFR, EST NON AFRICAN AMERICAN: 72 mL/min — AB (ref >90)
Glucose, Bld: 101 mg/dL — ABNORMAL HIGH (ref 70–99)
Potassium: 4.5 mmol/L (ref 3.5–5.1)
Sodium: 139 mmol/L (ref 135–145)

## 2014-12-11 LAB — CBC
HEMATOCRIT: 37.6 % — AB (ref 39.0–52.0)
Hemoglobin: 12.3 g/dL — ABNORMAL LOW (ref 13.0–17.0)
MCH: 28.5 pg (ref 26.0–34.0)
MCHC: 32.7 g/dL (ref 30.0–36.0)
MCV: 87.2 fL (ref 78.0–100.0)
PLATELETS: 231 10*3/uL (ref 150–400)
RBC: 4.31 MIL/uL (ref 4.22–5.81)
RDW: 14 % (ref 11.5–15.5)
WBC: 14.4 10*3/uL — ABNORMAL HIGH (ref 4.0–10.5)

## 2014-12-11 LAB — HEMOGLOBIN A1C
HEMOGLOBIN A1C: 5.7 % — AB (ref 4.8–5.6)
MEAN PLASMA GLUCOSE: 117 mg/dL

## 2014-12-11 LAB — TROPONIN I: TROPONIN I: 5.91 ng/mL — AB (ref <0.031)

## 2014-12-11 MED ORDER — TICAGRELOR 90 MG PO TABS: 90.0000 mg | ORAL_TABLET | Freq: Two times a day (BID) | ORAL | Status: DC

## 2014-12-11 MED ORDER — ASPIRIN 81 MG PO TBEC: 81.0000 mg | DELAYED_RELEASE_TABLET | Freq: Every day | ORAL | Status: AC

## 2014-12-11 MED ORDER — AMLODIPINE BESYLATE 5 MG PO TABS: 5.0000 mg | ORAL_TABLET | Freq: Every day | ORAL | Status: DC

## 2014-12-11 MED ORDER — METOPROLOL TARTRATE 25 MG PO TABS: 25.0000 mg | ORAL_TABLET | Freq: Two times a day (BID) | ORAL | Status: DC

## 2014-12-11 MED ORDER — AMLODIPINE BESYLATE 5 MG PO TABS
5.0000 mg | ORAL_TABLET | Freq: Every day | ORAL | Status: DC
  Administered 2014-12-11: 12:00:00 5 mg via ORAL
  Filled 2014-12-11: qty 1

## 2014-12-11 MED ORDER — ATORVASTATIN CALCIUM 80 MG PO TABS: 80.0000 mg | ORAL_TABLET | Freq: Every day | ORAL | Status: DC

## 2014-12-11 MED ORDER — NITROGLYCERIN 0.4 MG SL SUBL: 0.4000 mg | SUBLINGUAL_TABLET | SUBLINGUAL | Status: DC | PRN

## 2014-12-11 MED ORDER — MAGNESIUM HYDROXIDE 400 MG/5ML PO SUSP
30.0000 mL | Freq: Every day | ORAL | Status: DC | PRN
Start: 2014-12-11 — End: 2014-12-11

## 2014-12-11 MED ORDER — ISOSORBIDE MONONITRATE ER 30 MG PO TB24: 30.0000 mg | ORAL_TABLET | Freq: Every day | ORAL | Status: DC

## 2014-12-11 MED FILL — Sodium Chloride IV Soln 0.9%: INTRAVENOUS | Qty: 50 | Status: AC

## 2014-12-11 NOTE — Discharge Instructions (Signed)
Acute Coronary Syndrome °Acute coronary syndrome (ACS) is an urgent problem in which the blood and oxygen supply to the heart is critically deficient. ACS requires hospitalization because one or more coronary arteries may be blocked. °ACS represents a range of conditions including: °· Previous angina that is now unstable, lasts longer, happens at rest, or is more intense. °· A heart attack, with heart muscle cell injury and death. °There are three vital coronary arteries that supply the heart muscle with blood and oxygen so that it can pump blood effectively. If blockages to these arteries develop, blood flow to the heart muscle is reduced. If the heart does not get enough blood, angina may occur as the first warning sign. °SYMPTOMS  °· The most common signs of angina include: °¨ Tightness or squeezing in the chest. °¨ Feeling of heaviness on the chest. °¨ Discomfort in the arms, neck, back, or jaw. °¨ Shortness of breath and nausea. °¨ Cold, wet skin. °· Angina is usually brought on by physical effort or excitement which increase the oxygen needs of the heart. These states increase the blood flow needs of the heart beyond what can be delivered. °· Other symptoms that are not as common include: °¨ Fatigue °¨ Unexplained feelings of nervousness or anxiety °¨ Weakness °¨ Diarrhea °· Sometimes, you may not have noticed any symptoms at all but still suffered a cardiac injury. °TREATMENT  °· Medicines to help discomfort may include nitroglycerin (nitro) in the form of tablets or a spray for rapid relief, or longer-acting forms such as cream, patches, or capsules. (Be aware that there are many side effects and possible interactions with other drugs). °· Other medicines may be used to help the heart pump better. °· Procedures to open blocked arteries including angioplasty or stent placement to keep the arteries open. °· Open heart surgery may be needed when there are many blockages or they are in critical locations that  are best treated with surgery. °HOME CARE INSTRUCTIONS  °· Do not use any tobacco products including cigarettes, chewing tobacco, or electronic cigarettes. °· Take one baby or adult aspirin daily, if your health care provider advises. This helps reduce the risk of a heart attack. °· It is very important that you follow the angina treatment prescribed by your health care provider. Make arrangements for proper follow-up care. °· Eat a heart healthy diet with salt and fat restrictions as advised. °· Regular exercise is good for you as long as it does not cause discomfort. Do not begin any new type of exercise until you check with your health care provider. °· If you are overweight, you should lose weight. °· Try to maintain normal blood lipid levels. °· Keep your blood pressure under control as recommended by your health care provider. °· You should tell your health care provider right away about any increase in the severity or frequency of your chest discomfort or angina attacks. When you have angina, you should stop what you are doing and sit down. This may bring relief in 3 to 5 minutes. If your health care provider has prescribed nitro, take it as directed. °· If your health care provider has given you a follow-up appointment, it is very important to keep that appointment. Not keeping the appointment could result in a chronic or permanent injury, pain, and disability. If there is any problem keeping the appointment, you must call back to this facility for assistance. °SEEK IMMEDIATE MEDICAL CARE IF:  °· You develop nausea, vomiting, or shortness   of breath. °· You feel faint, lightheaded, or pass out. °· Your chest discomfort gets worse. °· You are sweating or experience sudden profound fatigue. °· You do not get relief of your chest pain after 3 doses of nitro. °· Your discomfort lasts longer than 15 minutes. °MAKE SURE YOU:  °· Understand these instructions. °· Will watch your condition. °· Will get help right  away if you are not doing well or get worse. °· Take all medicines as directed by your health care provider. °Document Released: 09/18/2005 Document Revised: 09/23/2013 Document Reviewed: 01/20/2014 °[employer]® Patient Information ©2015 [employer]. This information is not intended to replace advice given to you by your health care provider. Make sure you discuss any questions you have with your health care provider. ° °No driving for 24 hours. No lifting over 5 lbs for 1 week. No sexual activity for 1 week. You may return to work on 12/14/2014. Keep procedure site clean & dry. If you notice increased pain, swelling, bleeding or pus, call/return!  You may shower, but no soaking baths/hot tubs/pools for 1 week.  ° ° °

## 2014-12-11 NOTE — Progress Notes (Signed)
TR BAND REMOVAL  LOCATION:    right radial  DEFLATED PER PROTOCOL:    Yes.    TIME BAND OFF / DRESSING APPLIED:    19:30   SITE UPON ARRIVAL:    Level 0  SITE AFTER BAND REMOVAL:    Level 0  REVERSE ALLEN'S TEST:     positive  CIRCULATION SENSATION AND MOVEMENT:    Within Normal Limits   Yes.    COMMENTS:   Pt tolerated removal of TR band without complications.  Will continue to monitor patient.

## 2014-12-11 NOTE — Progress Notes (Signed)
CARDIAC REHAB PHASE I   PRE:  Rate/Rhythm: 105 ST  BP:  Supine: 173/84  Sitting:    Standing:     SaO2:   MODE:  Ambulation: 1000 ft   POST:  Rate/Rhythm: 129 ST  BP:  Supine:   Sitting: 232/97 recheck 229/97  Standing:     SaO2:  0800-0930 Pt tolerated ambulation well without c/o of co or SOB. BP elevated before walk and was higher after walk. I rechecked it and it remained elevated with rest. Reported BP to nurse. HR also elevated after walk 129 ST. Completed MI and stent education with pt. He voices understanding. Pt agrees to Outpt CRP in [place], will send referral.   Melina CopaLisa Ayron Fillinger RN 12/11/2014 9:42 AM

## 2014-12-11 NOTE — Discharge Summary (Addendum)
Discharge Summary   Patient ID: Matthew Walls,  MRN: [id number], DOB/AGE: 57-Feb-1957 57 y.o.  Admit date: 12/10/2014 Discharge date: 12/11/2014  Primary Care Provider: PROVIDER NOT IN SYSTEM Primary Cardiologist: Dr. Anne Fu  Discharge Diagnoses Principal Problem:   NSTEMI (non-ST elevated myocardial infarction) Active Problems:   CAD (coronary artery disease)   Hyperlipidemia   Essential hypertension   Allergies Allergies  Allergen Reactions  . Peanut-Containing Drug Products Swelling    Procedures  Cardiac catheterization 12/10/2014  CARDIAC CATHETERIZATION/PERCUTANEOUS CORONARY INTERVENTION   IMPRESSION:  Non-ST segment elevation myocardial infarction secondary to total occlusion of the left circumflex vessel.  Three-vessel CAD with smooth 30% narrowing in the LAD, 30% ostial, 40-50% proximal followed by 70% AV groove circumflex stenosis prior to being totally occluded, and moderate RCA stenoses of 50-60% in the mid segment, 30% beyond the acute margin and 60% in the region of the PDA takeoff.  Successful percutaneous cardiac intervention of the left circumflex coronary artery with PTCA and DES stenting of the AV groove circumflex, and several sites in the obtuse 1 marginal vessel with insertion of a 3.034 mm Resolute DES stent postdilated 3.23 mm with the entire stented region reduced to 0%. There is resumption of brisk TIMI-3 flow.   RECOMMENDATION:  Medical therapy for concomitant CAD. Patient will continue with dual antiplatelet therapy for minimum of a year and may benefit from long-term treatment beyond a year based on Pegasys trial data at areduce Brilinta dose. Aggressive statin therapy will be implemented.    Hospital Course  The patient is a 59 year old male with past medical history of psoriasis, however no past cardiac history. He describes the pain as a pressure-like sensation and worse with exertion up stairs with some associated shortness of breath. He  presented to ED on 12/10/2014 after recurrent chest pain that did not go away. He eventually received a sublingual nitroglycerin with resolution of symptoms. On arrival to the ED, it was noted that his troponin was elevated. IV heparin was started and he was subsequently transferred to [facility].   He was admitted to cardiology service, overnight troponin trended up to 7.51. A1C 5.7. Lipid panel shows his cholesterol 278, LDL 207, HDL 38, triglyceride 164. He was started on high-dose statin medication and aspirin. He was seen the morning of 12/10/2014, a d-dimer was obtained for sinus tachycardia which came back mildly elevated at 1.07. Metoprolol 25 mg twice a day was added for hypertension. After discussing with the patient various options, patient underwent cardiac catheterization on 12/10/2014 which showed 30% narrowing in LAD, 40-50% proximal left circumflex stenosis followed by 70% AV groove stenosis prior to to being totally occluded, 50-60% mid RCA stenosis. The left circumflex occlusion was treated with PTCA and DES (3.034 mm Resolute DES stent postdilated 3.23 mm). Post cath, patient was placed on aspirin and Brilinta. PO Imdur was started as well.  Patient was seen in the morning of 12/11/2014, which time he denies any significant chest discomfort or shortness breath, he is deemed stable for discharge from cardiology perspective. His right radial cath site appears to be stable. I discussed with MD regarding his elevated d-dimer, suspicion for PE low at this time, will not pursue further testing as patient is currently CP free without SOB, LE edema or calf tenderness. His BP med likely will need further adjustment on followup.  Prior to discharge, patient BP went up to 200s after walking with cardiac rehab. Afterward, his BP continue to be around 180s, some improvement after  morning metoprolol. I have added amlodipine 5mg  daily to his medical regimen  Discharge Vitals Blood pressure 185/91,  pulse 95, temperature 98.1 F (36.7 C), temperature source Oral, resp. rate 18, height 5\' 9"  (1.753 m), weight 290 lb 2 oz (131.6 kg), SpO2 98 %.  Filed Weights   12/10/14 0114 12/10/14 0500 12/11/14 0400  Weight: 260 lb (117.935 kg) 285 lb 0.9 oz (129.3 kg) 290 lb 2 oz (131.6 kg)    Labs  CBC  Recent Labs  12/10/14 0156 12/10/14 0618 12/11/14 0352  WBC 11.4* 12.9* 14.4*  NEUTROABS 7.6  --   --   HGB 13.9 13.4 12.3*  HCT 42.1 40.3 37.6*  MCV 85.4 85.4 87.2  PLT 233 244 231   Basic Metabolic Panel  Recent Labs  12/10/14 0618 12/11/14 0352  NA 139 139  K 3.8 4.5  CL 104 107  CO2 29 26  GLUCOSE 109* 101*  BUN 18 12  CREATININE 1.05 1.12  CALCIUM 8.3* 8.3*  MG 2.3  --    Liver Function Tests  Recent Labs  12/10/14 0156  AST 20  ALT 17  ALKPHOS 59  BILITOT 0.4  PROT 6.7  ALBUMIN 3.4*    Recent Labs  12/10/14 0156  LIPASE 51   Cardiac Enzymes  Recent Labs  12/10/14 1050 12/10/14 1650 12/10/14 2325  TROPONINI 2.45* 7.51* 5.91*   D-Dimer  Recent Labs  12/10/14 1050  DDIMER 1.09*   Hemoglobin A1C  Recent Labs  12/10/14 0618  HGBA1C 5.7*   Fasting Lipid Panel  Recent Labs  12/10/14 0618  CHOL 278*  HDL 38*  LDLCALC 207*  TRIG 164*  CHOLHDL 7.3   Thyroid Function Tests  Recent Labs  12/10/14 0618  TSH 4.439    Disposition  Pt is being discharged home today in good condition.  Follow-up Plans & Appointments      Follow-up Information    Follow up with Jacolyn ReedyMichele Lenze, PA-C On 01/04/2015.   Specialty:  Cardiology   Why:  10:30am   Contact information:   [address] [place] [address] [phone]       Follow up with Maye HidesMILLER, RYAN DEAN, PA On 12/15/2014.   Specialty:  Physician Assistant   Why:  9:00AM please bring photo ID and insurance card to your appointment.     Contact information:   [address] [place] [address] [phone]       Discharge Medications    Medication List      TAKE these medications        amLODipine 5 MG tablet  Commonly known as:  NORVASC  Take 1 tablet (5 mg total) by mouth daily.     aspirin 81 MG EC tablet  Take 1 tablet (81 mg total) by mouth daily.  Notes to Patient:  NEW MEDICINE     atorvastatin 80 MG tablet  Commonly known as:  LIPITOR  Take 1 tablet (80 mg total) by mouth daily at 6 PM.  Notes to Patient:  NEW MEDICINE     isosorbide mononitrate 30 MG 24 hr tablet  Commonly known as:  IMDUR  Take 1 tablet (30 mg total) by mouth daily.  Notes to Patient:  NEW MEDICINE     metoprolol tartrate 25 MG tablet  Commonly known as:  LOPRESSOR  Take 1 tablet (25 mg total) by mouth 2 (two) times daily.  Notes to Patient:  NEW MEDICINE     nitroGLYCERIN 0.4 MG  SL tablet  Commonly known as:  NITROSTAT  Place 1 tablet (0.4 mg total) under the tongue every 5 (five) minutes as needed for chest pain.  Notes to Patient:  NEW MEDICINE     ticagrelor 90 MG Tabs tablet  Commonly known as:  BRILINTA  Take 1 tablet (90 mg total) by mouth 2 (two) times daily.  Notes to Patient:  NEW MEDICINE         Duration of Discharge Encounter   Greater than 30 minutes including physician time.  Ramond Dial PA-C Pager: [phone] 12/11/2014, 11:28 AM   I have examined the patient and reviewed assessment and plan and discussed with patient.  Agree with above as stated.   continue dual antiplatelet therapy. Please see my note from earlier today.  Right radial site intact.    Corky Crafts, MD

## 2014-12-11 NOTE — Progress Notes (Signed)
Patient Name: Matthew Walls Date of Encounter: 12/11/2014  Primary Cardiologist: new - Dr. Anne FuSkains   Principal Problem:   NSTEMI (non-ST elevated myocardial infarction) Active Problems:   CAD (coronary artery disease)   Hyperlipidemia   Essential hypertension    SUBJECTIVE  Denies any CP or SOB. Denies any recent LE swelling or calf tenderness.   CURRENT MEDS . aspirin EC  81 mg Oral Daily  . atorvastatin  80 mg Oral q1800  . Influenza vac split quadrivalent PF  0.5 mL Intramuscular Tomorrow-1000  . isosorbide mononitrate  30 mg Oral Daily  . metoprolol tartrate  25 mg Oral BID  . ticagrelor  90 mg Oral BID    OBJECTIVE  Filed Vitals:   12/11/14 0100 12/11/14 0200 12/11/14 0400 12/11/14 0515  BP: 127/76 138/76  151/52  Pulse: 84 82    Temp:   97.5 F (36.4 C)   TempSrc:   Oral   Resp:      Height:      Weight:   290 lb 2 oz (131.6 kg)   SpO2: 99% 97%      Intake/Output Summary (Last 24 hours) at 12/11/14 0718 Last data filed at 12/11/14 0600  Gross per 24 hour  Intake 2797.5 ml  Output   1000 ml  Net 1797.5 ml   Filed Weights   12/10/14 0114 12/10/14 0500 12/11/14 0400  Weight: 260 lb (117.935 kg) 285 lb 0.9 oz (129.3 kg) 290 lb 2 oz (131.6 kg)    PHYSICAL EXAM  General: Pleasant, NAD. Neuro: Alert and oriented X 3. Moves all extremities spontaneously. Psych: Normal affect. HEENT:  Normal  Neck: Supple without bruits or JVD. Lungs:  Resp regular and unlabored, CTA. Heart: RRR no s3, s4, or murmurs. R radial cath site stable.  Abdomen: Soft, non-tender, non-distended, BS + x 4.  Extremities: No clubbing, cyanosis or edema. DP/PT/Radials 2+ and equal bilaterally. Diffuse psoriatic changes through entire body  Accessory Clinical Findings  CBC  Recent Labs  12/10/14 0156 12/10/14 0618 12/11/14 0352  WBC 11.4* 12.9* 14.4*  NEUTROABS 7.6  --   --   HGB 13.9 13.4 12.3*  HCT 42.1 40.3 37.6*  MCV 85.4 85.4 87.2  PLT 233 244 231   Basic  Metabolic Panel  Recent Labs  12/10/14 0618 12/11/14 0352  NA 139 139  K 3.8 4.5  CL 104 107  CO2 29 26  GLUCOSE 109* 101*  BUN 18 12  CREATININE 1.05 1.12  CALCIUM 8.3* 8.3*  MG 2.3  --    Liver Function Tests  Recent Labs  12/10/14 0156  AST 20  ALT 17  ALKPHOS 59  BILITOT 0.4  PROT 6.7  ALBUMIN 3.4*    Recent Labs  12/10/14 0156  LIPASE 51   Cardiac Enzymes  Recent Labs  12/10/14 1050 12/10/14 1650 12/10/14 2325  TROPONINI 2.45* 7.51* 5.91*   BNP Invalid input(s): POCBNP D-Dimer  Recent Labs  12/10/14 1050  DDIMER 1.09*   Fasting Lipid Panel  Recent Labs  12/10/14 0618  CHOL 278*  HDL 38*  LDLCALC 207*  TRIG 164*  CHOLHDL 7.3   Thyroid Function Tests  Recent Labs  12/10/14 0618  TSH 4.439    TELE NSR with HR 70s    ECG  NSR with nonspecific T wave changes   Radiology/Studies  Dg Chest 2 View  12/10/2014   CLINICAL DATA:  Chest pain and pressure for 2 weeks. Shortness of breath. Nonsmoker. History hypertension.  EXAM: CHEST  2 VIEW  COMPARISON:  05/26/2007  FINDINGS: Cardiac enlargement. Mild to pulmonary vascular congestion. Interstitial changes in the lungs likely to represent interstitial edema. No focal consolidation. No blunting of costophrenic angles. No pneumothorax. Tortuous aorta.  IMPRESSION: Cardiac enlargement with pulmonary vascular congestion and interstitial edema.   Electronically Signed   By: Burman Nieves M.D.   On: 12/10/2014 01:33    ASSESSMENT AND PLAN  1. NSTEMI  - 3v dx with 30% narrowing in LAD, 70% stenosis in AV groove LCx prior to total occlusion treated with PTCA/DES (3.034 mm Resolute DES stent postdilated 3.23 mm), 50-60% mid RCA stenosis  - aggressive medical therapy. ASA,  metoprolol, Imdur and Brilinta.   - stable for discharge depend on whether to pursue further testing for abnormal d-dimer   2. CAD, newly diagnosed  3. Hyperlipidemia  4. Elevated d-dimer: will discuss with MD,  patient denies recurrent CP after cath, no SOB, no recent ipsilateral leg swelling or calf tenderness.  5. HTN  6. Obesity  7. Psoriasis  8. Former smoker  Medical illustrator, Azalee Course PA-C Pager: [phone]  I have examined the patient and reviewed assessment and plan and discussed with patient.  Agree with above as stated.  Spoke importance of diet control to reduce his cardiovascular risk. He had a regular soda by his bed. I talked to him about trying to minimize sugar intake to lose weight. Continue dual antiplatelet therapy for at least a year. Would not pursue workup for PE.  Mckade Gurka S.

## 2015-01-04 ENCOUNTER — Encounter: Payer: Self-pay | Admitting: Physician Assistant

## 2015-01-04 ENCOUNTER — Ambulatory Visit (INDEPENDENT_AMBULATORY_CARE_PROVIDER_SITE_OTHER): Payer: BLUE CROSS/BLUE SHIELD | Admitting: Physician Assistant

## 2015-01-04 ENCOUNTER — Encounter: Payer: Self-pay | Admitting: Cardiology

## 2015-01-04 VITALS — BP 154/90 | HR 62 | Ht 69.0 in | Wt 257.8 lb

## 2015-01-04 DIAGNOSIS — I214 Non-ST elevation (NSTEMI) myocardial infarction: Secondary | ICD-10-CM

## 2015-01-04 DIAGNOSIS — I10 Essential (primary) hypertension: Secondary | ICD-10-CM | POA: Diagnosis not present

## 2015-01-04 DIAGNOSIS — I2510 Atherosclerotic heart disease of native coronary artery without angina pectoris: Secondary | ICD-10-CM

## 2015-01-04 DIAGNOSIS — E785 Hyperlipidemia, unspecified: Secondary | ICD-10-CM

## 2015-01-04 MED ORDER — AMLODIPINE BESYLATE 10 MG PO TABS
10.0000 mg | ORAL_TABLET | Freq: Every day | ORAL | Status: DC
Start: 2015-01-04 — End: 2015-07-30

## 2015-01-04 NOTE — Progress Notes (Signed)
Cardiology Office Note   Date:  01/04/2015   ID:  Matthew Walls, DOB March 11, 1956, MRN [id number]  PCP:  Dr. Alycia Rossetti at [facility] primary care Cardiologist:  Donato Schultz, M.D.  Chief Complaint: Follow-up MI    History of Present Illness: Matthew Walls is a 59 y.o. male who presents for post hospital follow-up. He was admitted 12/10/14  with the non-STEMI treated with PTCA and DES of the AV groove circumflex in several sites in the OM1. He has residual 30% LAD, and moderate RCA stenosis of 50-60% mid segment and 60% and the region of the PDA takeoff. EF 50% with mild mid inferior hypo-contractility. He was sent home on aspirin and Brilinta. Amlodipine was added prior to discharge because of elevated blood pressures. Patient also has hyperlipidemia.  Patient comes in today feeling so much better. He no longer has dyspnea on exertion and has had no chest pain. He is walking 30 minutes a day and has lost 28 pounds by watching his food intake. He had an appointment with his primary care Dr. Alycia Rossetti at [facility] last week and had blood work drawn. He will be seen in 2 weeks for follow-up of the blood work.    Past Medical History  Diagnosis Date  . Psoriasis   . NSTEMI (non-ST elevated myocardial infarction) 12/09/2014  . Hypertension   . History of kidney stones   . GERD (gastroesophageal reflux disease)   . Headache   . CAD (coronary artery disease)     cath 12/10/2014 occluded LCx treated with PTCA and DES (3.034 mm Resolute DES stent postdilated 3.23 mm)    Past Surgical History  Procedure Laterality Date  . Rhinoplasty    . Finger surgery Right   . Left heart catheterization with coronary angiogram N/A 12/10/2014    Procedure: LEFT HEART CATHETERIZATION WITH CORONARY ANGIOGRAM;  Surgeon: Lennette Bihari, MD;  Location: [facility] CATH LAB;  Service: Cardiovascular;  Laterality: N/A;  . Percutaneous coronary stent intervention (pci-s)  12/10/2014    Procedure: PERCUTANEOUS CORONARY STENT INTERVENTION (PCI-S);   Surgeon: Lennette Bihari, MD;  Location: [facility] CATH LAB;  Service: Cardiovascular;;  Mid Circ Resolute [phone]     Current Outpatient Prescriptions  Medication Sig Dispense Refill  . amLODipine (NORVASC) 5 MG tablet Take 1 tablet (5 mg total) by mouth daily. 30 tablet 5  . aspirin EC 81 MG EC tablet Take 1 tablet (81 mg total) by mouth daily.    Marland Kitchen atorvastatin (LIPITOR) 80 MG tablet Take 1 tablet (80 mg total) by mouth daily at 6 PM. 30 tablet 11  . isosorbide mononitrate (IMDUR) 30 MG 24 hr tablet Take 1 tablet (30 mg total) by mouth daily. 30 tablet 5  . metoprolol tartrate (LOPRESSOR) 25 MG tablet Take 1 tablet (25 mg total) by mouth 2 (two) times daily. 60 tablet 5  . nitroGLYCERIN (NITROSTAT) 0.4 MG SL tablet Place 1 tablet (0.4 mg total) under the tongue every 5 (five) minutes as needed for chest pain. 25 tablet 3  . ticagrelor (BRILINTA) 90 MG TABS tablet Take 1 tablet (90 mg total) by mouth 2 (two) times daily. 180 tablet 3   No current facility-administered medications for this visit.    Allergies:   Peanut-containing drug products    Social History:  The patient  reports that he has quit smoking. He has never used smokeless tobacco. He reports that he does not drink alcohol or use illicit drugs.   History reviewed. No pertinent family history.  ROS:  Please see the history of present illness.   Otherwise, review of systems are positive for none.   All other systems are reviewed and negative.    PHYSICAL EXAM: BP 154/90 mmHg  Pulse 62  Ht  (1.753 m)  Wt 257 lb 12.8 oz (116.937 kg)  BMI 38.05 kg/m2 GEN: Obese, well developed, in no acute distress HEENT: normal Neck: no JVD, HJR, carotid bruits, or masses Cardiac: RRR; no gallop ,murmurs, rubs, thrill or heave,no edema,   Respiratory:  clear to auscultation bilaterally, normal work of breathing GI: soft, nontender, nondistended, + BS MS: no deformity or atrophy Extremities: Right radial artery at cath site without  hematoma or hemorrhage good radial and brachial pulses, lower extremities without cyanosis, clubbing, edema, good distal pulses bilaterally.  Skin: Psoriasis all over his back Neuro:  Strength and sensation are intact Psych: euthymic mood, full affect   EKG:  EKG is ordered today. The ekg ordered today demonstrates normal sinus rhythm with LVH early repolarization changes   Recent Labs: 12/10/2014: ALT 17; B Natriuretic Peptide 131.9*; Magnesium 2.3; TSH 4.439 12/11/2014: BUN 12; Creatinine 1.12; Hemoglobin 12.3*; Platelets 231; Potassium 4.5; Sodium 139    Lipid Panel    Component Value Date/Time   CHOL 278* 12/10/2014 0618   TRIG 164* 12/10/2014 0618   HDL 38* 12/10/2014 0618   CHOLHDL 7.3 12/10/2014 0618   VLDL 33 12/10/2014 0618   LDLCALC 207* 12/10/2014 0618      Wt Readings from Last 3 Encounters:  12/11/14 290 lb 2 oz (131.6 kg)      Other studies Reviewed: Additional studies/ records that were reviewed today include and review of the records demonstrates: Cardiac cath 12/10/14 Left ventriculography revealed low normal LV function with an ejection fraction of 50%. There was mild mid inferior hypocontractility.  Following PCI of the totally occluded AV groove circumflex with segmental lesions into the obtuse marginal vessel, treated with PTCA, and ultimate DES stenting with a Resolute 3.034 mm stent postdilated to 3.23 mm, the 70% stenosis was reduced to 0%, 100% occlusion to 0% and a 70% stenoses in the obtuse marginal vessel reduced to 0%.  Beyond the stent, the vessel tapered and gave rise to 3 additional branches.     IMPRESSION:  Non-ST segment elevation myocardial infarction secondary to total occlusion of the left circumflex vessel.  Three-vessel CAD with smooth 30% narrowing in the LAD, 30% ostial, 40-50% proximal followed by 70% AV groove circumflex stenosis prior to being totally occluded, and moderate RCA stenoses of 50-60% in the mid segment, 30% beyond the  acute margin and 60% in the region of the PDA takeoff.  Successful percutaneous cardiac intervention of the left circumflex coronary artery with PTCA and DES stenting of the AV groove circumflex, and several sites in the obtuse 1 marginal vessel with insertion of a 3.034 mm Resolute DES stent postdilated 3.23 mm with the entire stented region reduced to 0%.  There is resumption of brisk TIMI-3 flow.   RECOMMENDATION:  Medical therapy for concomitant CAD.  Patient will continue with dual antiplatelet therapy for minimum of a year and may benefit from long-term treatment beyond a year based on Pegasys trial data at areduce Brilinta dose.  Aggressive statin therapy will be implemented.    ASSESSMENT AND PLAN: CAD (coronary artery disease) Patient had non-STEMI treated with drug-eluting stent to the AV groove circumflex in several sites in the OM1. He has residual CAD as listed above to be treated medically.  Patient is doing well without symptoms and feels so much better. Continue Brilinta, aspirin, Imdur, Lopressor and Norvasc. Follow-up with Dr. Chales Abrahamsskeins in 2 months.   Essential hypertension Blood pressure remains elevated. He says it has been 135/80-154/90 at home. Increase amlodipine to 10 mg once daily. Continue weight loss program will help lower his blood pressure. 2 g sodium diet.   Hyperlipidemia Lipitor is new for him. He had blood work last week by Dr[facility]tiyan at [facility]. I've asked him to send these results to us. He believes he had a fasting lipid panel and LFTs. This needs to be verified.   Morbid obesity Patient has lost 28 pounds since hospitalization with exercise and diet. Recommend continued weight loss program.      Elson ClanSigned, Mozel Burdett, PA-C  01/04/2015 10:34 AM    [facility] [address], [place], [place]  [address] Phone: [phone](336) [phone]; Fax: [phone]

## 2015-01-04 NOTE — Assessment & Plan Note (Signed)
Lipitor is new for him. He had blood work last week by [facility] at [facility]. I've asked him to send these results to us. He believes he had a fasting lipid panel and LFTs. This needs to be verified.

## 2015-01-04 NOTE — Assessment & Plan Note (Signed)
Patient has lost 28 pounds since hospitalization with exercise and diet. Recommend continued weight loss program.

## 2015-01-04 NOTE — Assessment & Plan Note (Signed)
Blood pressure remains elevated. He says it has been 135/80-154/90 at home. Increase amlodipine to 10 mg once daily. Continue weight loss program will help lower his blood pressure. 2 g sodium diet.

## 2015-01-04 NOTE — Assessment & Plan Note (Signed)
Patient had non-STEMI treated with drug-eluting stent to the AV groove circumflex in several sites in the OM1. He has residual CAD as listed above to be treated medically. Patient is doing well without symptoms and feels so much better. Continue Brilinta, aspirin, Imdur, Lopressor and Norvasc. Follow-up with Dr. Chales Abrahamsskeins in 2 months.

## 2015-01-04 NOTE — Patient Instructions (Addendum)
Your physician has recommended you make the following change in your medication: Increase Norvasc ( 10 mg ) daily, sent in to your pharmacy.   Your physician recommends that you schedule a follow-up appointment with Dr. Anne FuSkains.  Please fax Dr. Anne FuSkains all recent lab results from Dr. Coral Elseyan's office.

## 2015-01-05 ENCOUNTER — Encounter: Payer: Self-pay | Admitting: Physician Assistant

## 2015-03-05 ENCOUNTER — Ambulatory Visit (INDEPENDENT_AMBULATORY_CARE_PROVIDER_SITE_OTHER): Payer: BLUE CROSS/BLUE SHIELD | Admitting: Cardiology

## 2015-03-05 ENCOUNTER — Encounter: Payer: Self-pay | Admitting: Cardiology

## 2015-03-05 ENCOUNTER — Telehealth: Payer: Self-pay | Admitting: Cardiology

## 2015-03-05 VITALS — BP 124/78 | HR 64 | Ht 69.0 in | Wt 233.2 lb

## 2015-03-05 DIAGNOSIS — I2583 Coronary atherosclerosis due to lipid rich plaque: Principal | ICD-10-CM

## 2015-03-05 DIAGNOSIS — I10 Essential (primary) hypertension: Secondary | ICD-10-CM | POA: Diagnosis not present

## 2015-03-05 DIAGNOSIS — I214 Non-ST elevation (NSTEMI) myocardial infarction: Secondary | ICD-10-CM

## 2015-03-05 DIAGNOSIS — I2510 Atherosclerotic heart disease of native coronary artery without angina pectoris: Secondary | ICD-10-CM | POA: Diagnosis not present

## 2015-03-05 DIAGNOSIS — E785 Hyperlipidemia, unspecified: Secondary | ICD-10-CM

## 2015-03-05 LAB — LIPID PANEL
CHOL/HDL RATIO: 3
Cholesterol: 84 mg/dL (ref 0–200)
HDL: 26.7 mg/dL — AB (ref >39.00)
LDL Cholesterol: 38 mg/dL (ref 0–99)
NONHDL: 57.3
TRIGLYCERIDES: 97 mg/dL (ref 0.0–149.0)
VLDL: 19.4 mg/dL (ref 0.0–40.0)

## 2015-03-05 LAB — ALT: ALT: 19 U/L (ref 0–53)

## 2015-03-05 MED ORDER — AMLODIPINE BESYLATE 10 MG PO TABS: 10.0000 mg | ORAL_TABLET | Freq: Every day | ORAL | Status: DC

## 2015-03-05 NOTE — Progress Notes (Signed)
Cardiology Office Note   Date:  03/05/2015   ID:  Matthew Walls, DOB [birth year], MRN [id number]  PCP:  PROVIDER NOT IN SYSTEM  Cardiologist:   Donato Schultz, MD       History of Present Illness: Matthew Walls is a 59 y.o. male who presents for follow-up of coronary artery disease status post non-ST elevation myocardial infarction in March 2016 with drug-eluting stent placement to circumflex artery here for follow-up.  -Excellent job with weight loss, approximately 40 pounds. Been walking 45 minutes a day. No chest pain, no shortness of breath. He has not had chest pain since the initial hospital day.  He has been compliant with his medications. No bleeding. No myalgias. Original cholesterol LDL was 207.    Past Medical History  Diagnosis Date  . Psoriasis   . NSTEMI (non-ST elevated myocardial infarction) 12/09/2014  . Hypertension   . History of kidney stones   . GERD (gastroesophageal reflux disease)   . Headache   . CAD (coronary artery disease)     cath 12/10/2014 occluded LCx treated with PTCA and DES (3.034 mm Resolute DES stent postdilated 3.23 mm)    Past Surgical History  Procedure Laterality Date  . Rhinoplasty    . Finger surgery Right   . Left heart catheterization with coronary angiogram N/A 12/10/2014    Procedure: LEFT HEART CATHETERIZATION WITH CORONARY ANGIOGRAM;  Surgeon: Lennette Bihari, MD;  Location: [facility] CATH LAB;  Service: Cardiovascular;  Laterality: N/A;  . Percutaneous coronary stent intervention (pci-s)  12/10/2014    Procedure: PERCUTANEOUS CORONARY STENT INTERVENTION (PCI-S);  Surgeon: Lennette Bihari, MD;  Location: [facility] CATH LAB;  Service: Cardiovascular;;  Mid Circ Resolute [phone]     Current Outpatient Prescriptions  Medication Sig Dispense Refill  . amLODipine (NORVASC) 5 MG tablet Take 1 tablet by mouth daily.  4  . aspirin EC 81 MG EC tablet Take 1 tablet (81 mg total) by mouth daily.    Marland Kitchen atorvastatin (LIPITOR) 80 MG tablet Take 1 tablet (80 mg  total) by mouth daily at 6 PM. 30 tablet 11  . isosorbide mononitrate (IMDUR) 30 MG 24 hr tablet Take 1 tablet (30 mg total) by mouth daily. 30 tablet 5  . metoprolol tartrate (LOPRESSOR) 25 MG tablet Take 1 tablet (25 mg total) by mouth 2 (two) times daily. 60 tablet 5  . nitroGLYCERIN (NITROSTAT) 0.4 MG SL tablet Place 1 tablet (0.4 mg total) under the tongue every 5 (five) minutes as needed for chest pain. (Patient taking differently: Place 0.4 mg under the tongue every 5 (five) minutes as needed for chest pain ((MAX of 3 doses)). ) 25 tablet 3  . ticagrelor (BRILINTA) 90 MG TABS tablet Take 1 tablet (90 mg total) by mouth 2 (two) times daily. 180 tablet 3  . Vitamin D, Ergocalciferol, (DRISDOL) 50000 UNITS CAPS capsule   11   No current facility-administered medications for this visit.    Allergies:   Peanut-containing drug products    Social History:  The patient  reports that he has quit smoking. He has never used smokeless tobacco. He reports that he does not drink alcohol or use illicit drugs.   Family History:  The patient's family history includes CVA in his mother.    ROS:  Please see the history of present illness.   Otherwise, review of systems are positive for none.   All other systems are reviewed and negative.    PHYSICAL EXAM: VS:  BP 124/78  mmHg  Pulse 64  Ht 5\' 9"  (1.753 m)  Wt 233 lb 3.2 oz (105.779 kg)  BMI 34.42 kg/m2 , BMI Body mass index is 34.42 kg/(m^2). GEN: Well nourished, well developed, in no acute distress HEENT: normal Neck: no JVD, carotid bruits, or masses Cardiac: RRR; no murmurs, rubs, or gallops,no edema  Respiratory:  clear to auscultation bilaterally, normal work of breathing GI: soft, nontender, nondistended, + BS overweight  MS: no deformity or atrophy Skin: warm and dry, no rash Neuro:  Strength and sensation are intact Psych: euthymic mood, full affect   EKG:  EKG is not ordered today.    Recent Labs: 12/10/2014: ALT 17; B  Natriuretic Peptide 131.9*; Magnesium 2.3; TSH 4.439 12/11/2014: BUN 12; Creatinine 1.12; Hemoglobin 12.3*; Platelets 231; Potassium 4.5; Sodium 139    Lipid Panel    Component Value Date/Time   CHOL 278* 12/10/2014 0618   TRIG 164* 12/10/2014 0618   HDL 38* 12/10/2014 0618   CHOLHDL 7.3 12/10/2014 0618   VLDL 33 12/10/2014 0618   LDLCALC 207* 12/10/2014 0618      Wt Readings from Last 3 Encounters:  03/05/15 233 lb 3.2 oz (105.779 kg)  01/04/15 257 lb 12.8 oz (116.937 kg)  12/11/14 290 lb 2 oz (131.6 kg)      Other studies Reviewed: Additional studies/ records that were reviewed today inclhospital records reviewed, lab work reviewed, catheterization report reviewed. Review of the above records demonstrates: As above   ASSESSMENT AND PLAN:  1.  non-ST elevation myocardial infarction-DES obtuse marginal. Understands DAPT for one year. has residual mild nonobstructive coronary artery disease in other vessels.  2. Hyperlipidemia-LDL 207 originally. On atorvastatin 80 mg. Checking lipid profile and ALT today. LDL goal 70.  3. Morbid obesity-excellent job with weight loss, 40 pounds. Continue walking.  Essential hypertension-continue with current medications. Low-dose beta blocker noted. He will call us with amlodipine dosage.   medicines are reviewed at length with the patient today.  The patient does not have concerns regarding medicines.  The following changes have been made:  no change  Labs/ tests ordered today include: lipids, ALT.     Orders Placed This Encounter  Procedures  . Lipid panel  . ALT     Disposition:   FU with Skains in 6 months  Signed, Donato SchultzSKAINS, MARK, MD  03/05/2015 8:37 AM    [facility] [address], [place], [place]  [address] Phone: [phone]([phone]; Fax: [phone]

## 2015-03-05 NOTE — Telephone Encounter (Signed)
New message      Pt was seen earlier today.  He is on amlodipine 10mg  daily not 5mg .

## 2015-03-05 NOTE — Addendum Note (Signed)
Addended by: Tonita PhoenixBOWDEN, Mac Dowdell K on: 03/05/2015 08:39 AM   Modules accepted: Orders

## 2015-03-05 NOTE — Telephone Encounter (Signed)
noted 

## 2015-03-05 NOTE — Patient Instructions (Signed)
Medication Instructions:  Your physician recommends that you continue on your current medications as directed. Please refer to the Current Medication list given to you today. Please call back and let us know if you are taking Amlodipine 5 mg a day or 10 mg a day.   Labwork: Lipid and ALT today.  Follow-Up: Follow up in 6 months with Dr. Anne FuSkains.  You will receive a letter in the mail 2 months before you are due.  Please call us when you receive this letter to schedule your follow up appointment.  Thank you for choosing [facility]!!

## 2015-03-08 ENCOUNTER — Other Ambulatory Visit: Payer: Self-pay | Admitting: *Deleted

## 2015-03-08 DIAGNOSIS — E785 Hyperlipidemia, unspecified: Secondary | ICD-10-CM

## 2015-03-09 ENCOUNTER — Telehealth: Payer: Self-pay | Admitting: Cardiology

## 2015-03-09 MED ORDER — ATORVASTATIN CALCIUM 40 MG PO TABS: 40.0000 mg | ORAL_TABLET | Freq: Every day | ORAL | Status: DC

## 2015-03-09 NOTE — Telephone Encounter (Signed)
New Message       Pt calling stating that his Generic for Lipitor was changed from 80 mg to 40 mg and he told Pam that he would just cut the pills in half. Pt states that it is more difficult than it sounds to cut them in half and he would like a new prescription called in to the [facility] on [address] [place]. Please call back and advise when this has been done.

## 2015-03-09 NOTE — Telephone Encounter (Addendum)
Left message to call back. Sent change for his Lipitor to 40 mg and sent to pharmacy of choice.

## 2015-03-10 NOTE — Telephone Encounter (Signed)
Patient aware.

## 2015-03-10 NOTE — Telephone Encounter (Signed)
Left second message.

## 2015-05-31 ENCOUNTER — Other Ambulatory Visit: Payer: Self-pay | Admitting: Physician Assistant

## 2015-05-31 NOTE — Telephone Encounter (Signed)
Please review for refill, [place] pt. 

## 2015-06-08 ENCOUNTER — Other Ambulatory Visit: Payer: BLUE CROSS/BLUE SHIELD

## 2015-07-30 ENCOUNTER — Other Ambulatory Visit: Payer: Self-pay | Admitting: Physician Assistant

## 2015-09-01 ENCOUNTER — Ambulatory Visit (INDEPENDENT_AMBULATORY_CARE_PROVIDER_SITE_OTHER): Payer: BLUE CROSS/BLUE SHIELD | Admitting: Cardiology

## 2015-09-01 ENCOUNTER — Encounter: Payer: Self-pay | Admitting: Cardiology

## 2015-09-01 VITALS — BP 124/68 | HR 61 | Ht 69.0 in | Wt 198.8 lb

## 2015-09-01 DIAGNOSIS — I10 Essential (primary) hypertension: Secondary | ICD-10-CM

## 2015-09-01 DIAGNOSIS — I252 Old myocardial infarction: Secondary | ICD-10-CM

## 2015-09-01 DIAGNOSIS — E663 Overweight: Secondary | ICD-10-CM

## 2015-09-01 DIAGNOSIS — I2510 Atherosclerotic heart disease of native coronary artery without angina pectoris: Secondary | ICD-10-CM

## 2015-09-01 DIAGNOSIS — I2583 Coronary atherosclerosis due to lipid rich plaque: Secondary | ICD-10-CM

## 2015-09-01 DIAGNOSIS — E785 Hyperlipidemia, unspecified: Secondary | ICD-10-CM

## 2015-09-01 NOTE — Patient Instructions (Addendum)
Medication Instructions:  Your physician recommends that you continue on your current medications as directed. Please refer to the Current Medication list given to you today. 1- You can stop Brilinta in March 2017 and continue your aspirin 81 mg by mouth.  Labwork: NONE  Testing/Procedures: NONE  Follow-Up: Your physician wants you to follow-up in: 6 months with Dr. Anne FuSkains. You will receive a reminder letter in the mail two months in advance. If you don't receive a letter, please call our office to schedule the follow-up appointment.  If you need a refill on your cardiac medications before your next appointment, please call your pharmacy.

## 2015-09-01 NOTE — Progress Notes (Signed)
Cardiology Office Note   Date:  09/01/2015   ID:  Matthew Walls, DOB [birth year], MRN [id number]  PCP:  PROVIDER NOT IN SYSTEM  Cardiologist:   Donato Schultz, MD       History of Present Illness: Matthew Walls is a 59 y.o. male who presents for follow-up of coronary artery disease status post non-ST elevation myocardial infarction in March 2016 with drug-eluting stent placement to circumflex artery.  -Excellent job with weight loss, approximately 70 pounds. Been walking 45 minutes a day. Using dietary suggestions from his hospitalization/cardiac rehabilitation. No chest pain, no shortness of breath. He has not had chest pain since the initial hospital day.  He has been compliant with his medications. No bleeding. No myalgias. Original cholesterol LDL was 207. Last check 38. Continuing with high intensity statin.    Past Medical History  Diagnosis Date  . Psoriasis   . NSTEMI (non-ST elevated myocardial infarction) (HCC) 12/09/2014  . Hypertension   . History of kidney stones   . GERD (gastroesophageal reflux disease)   . Headache   . CAD (coronary artery disease)     cath 12/10/2014 occluded LCx treated with PTCA and DES (3.034 mm Resolute DES stent postdilated 3.23 mm)    Past Surgical History  Procedure Laterality Date  . Rhinoplasty    . Finger surgery Right   . Left heart catheterization with coronary angiogram N/A 12/10/2014    Procedure: LEFT HEART CATHETERIZATION WITH CORONARY ANGIOGRAM;  Surgeon: Lennette Bihari, MD;  Location: [facility] CATH LAB;  Service: Cardiovascular;  Laterality: N/A;  . Percutaneous coronary stent intervention (pci-s)  12/10/2014    Procedure: PERCUTANEOUS CORONARY STENT INTERVENTION (PCI-S);  Surgeon: Lennette Bihari, MD;  Location: [facility] CATH LAB;  Service: Cardiovascular;;  Mid Circ Resolute [id number]     Current Outpatient Prescriptions  Medication Sig Dispense Refill  . amLODipine (NORVASC) 10 MG tablet TAKE 1 TABLET BY MOUTH DAILY 30 tablet 5  .  aspirin EC 81 MG EC tablet Take 1 tablet (81 mg total) by mouth daily.    Marland Kitchen atorvastatin (LIPITOR) 40 MG tablet Take 1 tablet (40 mg total) by mouth daily at 6 PM. 30 tablet 11  . isosorbide mononitrate (IMDUR) 30 MG 24 hr tablet TAKE 1 TABLET(30 MG) BY MOUTH DAILY 30 tablet 6  . metoprolol tartrate (LOPRESSOR) 25 MG tablet TAKE 1 TABLET(25 MG) BY MOUTH TWICE DAILY 60 tablet 6  . nitroGLYCERIN (NITROSTAT) 0.4 MG SL tablet Place 0.4 mg under the tongue every 5 (five) minutes as needed for chest pain (x 3 doses daily).    . ticagrelor (BRILINTA) 90 MG TABS tablet Take 1 tablet (90 mg total) by mouth 2 (two) times daily. 180 tablet 3  . Vitamin D, Ergocalciferol, (DRISDOL) 50000 UNITS CAPS capsule Take 50,000 Units by mouth every 7 (seven) days.   11   No current facility-administered medications for this visit.    Allergies:   Peanut-containing drug products    Social History:  The patient  reports that he has quit smoking. He has never used smokeless tobacco. He reports that he does not drink alcohol or use illicit drugs.   Family History:  The patient's family history includes CVA in his mother.    ROS:  Please see the history of present illness.   Otherwise, review of systems are positive for none.   All other systems are reviewed and negative.    PHYSICAL EXAM: VS:  BP 124/68 mmHg  Pulse 61  Ht  5\' 9"  (1.753 m)  Wt 198 lb 12.8 oz (90.175 kg)  BMI 29.34 kg/m2  SpO2 97% , BMI Body mass index is 29.34 kg/(m^2). GEN: Well nourished, well developed, in no acute distress HEENT: normal Neck: no JVD, carotid bruits, or masses Cardiac: RRR; no murmurs, rubs, or gallops,no edema  Respiratory:  clear to auscultation bilaterally, normal work of breathing GI: soft, nontender, nondistended, + BS overweight  MS: no deformity or atrophy Skin: warm and dry, no rash Neuro:  Strength and sensation are intact Psych: euthymic mood, full affect   EKG:  EKG is not ordered today.    Recent  Labs: 12/10/2014: B Natriuretic Peptide 131.9*; Magnesium 2.3; TSH 4.439 12/11/2014: BUN 12; Creatinine, Ser 1.12; Hemoglobin 12.3*; Platelets 231; Potassium 4.5; Sodium 139 03/05/2015: ALT 19    Lipid Panel    Component Value Date/Time   CHOL 84 03/05/2015 0840   TRIG 97.0 03/05/2015 0840   HDL 26.70* 03/05/2015 0840   CHOLHDL 3 03/05/2015 0840   VLDL 19.4 03/05/2015 0840   LDLCALC 38 03/05/2015 0840      Wt Readings from Last 3 Encounters:  09/01/15 198 lb 12.8 oz (90.175 kg)  03/05/15 233 lb 3.2 oz (105.779 kg)  01/04/15 257 lb 12.8 oz (116.937 kg)      Other studies Reviewed: Additional studies/ records that were reviewed today inclhospital records reviewed, lab work reviewed, catheterization report reviewed. Review of the above records demonstrates: As above   ASSESSMENT AND PLAN:  1. Non-ST elevation myocardial infarction-DES obtuse marginal 12/10/14. Understands DAPT for one year. Has residual mild nonobstructive coronary artery disease in other vessels. Overall doing very well with no anginal symptoms.  2. Hyperlipidemia-LDL 207 originally. Now 38. On atorvastatin 40 mg.  3. Overweight-excellent job with weight loss, 70 pounds. Was 270 pounds. Continue walking. Fantastic job. Congratulated.  4. Essential hypertension-continue with current medications. Low-dose beta blocker noted. He will call us with amlodipine dosage.   medicines are reviewed at length with the patient today.  The patient does not have concerns regarding medicines.  The following changes have been made:  no change  Labs/ tests ordered today include: lipids, ALT.     No orders of the defined types were placed in this encounter.     Disposition:   FU with Atina Feeley in 6 months  Signed, Donato SchultzSKAINS, Shanan Mcmiller, MD  09/01/2015 8:51 AM    [facility] [address], [place], [place]  [address] Phone: [phone]; Fax: [phone]

## 2015-12-16 ENCOUNTER — Other Ambulatory Visit: Payer: Self-pay | Admitting: Cardiology

## 2016-01-14 ENCOUNTER — Other Ambulatory Visit: Payer: Self-pay | Admitting: Cardiology

## 2016-03-01 ENCOUNTER — Ambulatory Visit (INDEPENDENT_AMBULATORY_CARE_PROVIDER_SITE_OTHER): Payer: BLUE CROSS/BLUE SHIELD | Admitting: Cardiology

## 2016-03-01 ENCOUNTER — Encounter: Payer: Self-pay | Admitting: Cardiology

## 2016-03-01 VITALS — BP 130/82 | HR 53 | Ht 69.0 in | Wt 204.4 lb

## 2016-03-01 DIAGNOSIS — I252 Old myocardial infarction: Secondary | ICD-10-CM

## 2016-03-01 DIAGNOSIS — I2510 Atherosclerotic heart disease of native coronary artery without angina pectoris: Secondary | ICD-10-CM

## 2016-03-01 DIAGNOSIS — E663 Overweight: Secondary | ICD-10-CM

## 2016-03-01 DIAGNOSIS — I2583 Coronary atherosclerosis due to lipid rich plaque: Principal | ICD-10-CM

## 2016-03-01 DIAGNOSIS — I10 Essential (primary) hypertension: Secondary | ICD-10-CM

## 2016-03-01 NOTE — Patient Instructions (Addendum)
**Note De-Identified Rc Amison Obfuscation** Medication Instructions:  Stop taking Isosorbide-all other medications remain the same.  Labwork: None  Testing/Procedures: None  Follow-Up: Your physician wants you to follow-up in: 1 year. You will receive a reminder letter in the mail two months in advance. If you don't receive a letter, please call our office to schedule the follow-up appointment.     If you need a refill on your cardiac medications before your next appointment, please call your pharmacy.

## 2016-03-01 NOTE — Progress Notes (Signed)
Cardiology Office Note   Date:  03/01/2016   ID:  Matthew Walls, DOB 03/26/1956, MRN [id number]  PCP:  PROVIDER NOT IN SYSTEM  Cardiologist:   Donato Schultz, MD       History of Present Illness: Matthew Walls is a 60 y.o. male who presents for follow-up of coronary artery disease status post non-ST elevation myocardial infarction in March 2016 with drug-eluting stent placement to circumflex artery.  -Excellent job with weight loss, approximately 70 pounds. Been walking 45 minutes a day. Using dietary suggestions from his hospitalization/cardiac rehabilitation. No chest pain, no shortness of breath. He has not had chest pain since the initial hospital day.  He has been compliant with his medications. No bleeding. No myalgias. Original cholesterol LDL was 207. Last check 38. Continuing with high intensity statin.    Past Medical History  Diagnosis Date  . Psoriasis   . NSTEMI (non-ST elevated myocardial infarction) (HCC) 12/09/2014  . Hypertension   . History of kidney stones   . GERD (gastroesophageal reflux disease)   . Headache   . CAD (coronary artery disease)     cath 12/10/2014 occluded LCx treated with PTCA and DES (3.034 mm Resolute DES stent postdilated 3.23 mm)    Past Surgical History  Procedure Laterality Date  . Rhinoplasty    . Finger surgery Right   . Left heart catheterization with coronary angiogram N/A 12/10/2014    Procedure: LEFT HEART CATHETERIZATION WITH CORONARY ANGIOGRAM;  Surgeon: Lennette Bihari, MD;  Location: [facility] CATH LAB;  Service: Cardiovascular;  Laterality: N/A;  . Percutaneous coronary stent intervention (pci-s)  12/10/2014    Procedure: PERCUTANEOUS CORONARY STENT INTERVENTION (PCI-S);  Surgeon: Lennette Bihari, MD;  Location: [facility] CATH LAB;  Service: Cardiovascular;;  Mid Circ Resolute [id number]     Current Outpatient Prescriptions  Medication Sig Dispense Refill  . amLODipine (NORVASC) 10 MG tablet TAKE 1 TABLET BY MOUTH DAILY 30 tablet 6  . aspirin  EC 81 MG EC tablet Take 1 tablet (81 mg total) by mouth daily.    Marland Kitchen atorvastatin (LIPITOR) 40 MG tablet Take 1 tablet (40 mg total) by mouth daily at 6 PM. 30 tablet 11  . ferrous sulfate 325 (65 FE) MG tablet Take 1 tablet by mouth daily.  3  . metoprolol tartrate (LOPRESSOR) 25 MG tablet TAKE 1 TABLET(25 MG) BY MOUTH TWICE DAILY 60 tablet 5  . nitroGLYCERIN (NITROSTAT) 0.4 MG SL tablet Place 0.4 mg under the tongue every 5 (five) minutes as needed for chest pain (x 3 doses daily).    . Vitamin D, Ergocalciferol, (DRISDOL) 50000 UNITS CAPS capsule Take 50,000 Units by mouth every 7 (seven) days.   11   No current facility-administered medications for this visit.    Allergies:   Peanut-containing drug products    Social History:  The patient  reports that he has quit smoking. He has never used smokeless tobacco. He reports that he does not drink alcohol or use illicit drugs.   Family History:  The patient's family history includes CVA in his mother.    ROS:  Please see the history of present illness.   Otherwise, review of systems are positive for none.   All other systems are reviewed and negative.    PHYSICAL EXAM: VS:  BP 130/82 mmHg  Pulse 53  Ht  (1.753 m)  Wt 204 lb 6.4 oz (92.715 kg)  BMI 30.17 kg/m2 , BMI Body mass index is 30.17 kg/(m^2). GEN: Well  nourished, well developed, in no acute distress HEENT: normal Neck: no JVD, carotid bruits, or masses Cardiac: RRR; no murmurs, rubs, or gallops,no edema  Respiratory:  clear to auscultation bilaterally, normal work of breathing GI: soft, nontender, nondistended, + BS overweight  MS: no deformity or atrophy Skin: warm and dry, no rash Neuro:  Strength and sensation are intact Psych: euthymic mood, full affect   EKG:   EKG ordered today 02/29/16 -sinus bradycardia rate 53 with peaked T waves V2, V3, V4 with associated J-point elevation as well in 2, aVF. T-wave peaking is accentuated when compared to prior.    Recent  Labs: 03/05/2015: ALT 19    Lipid Panel    Component Value Date/Time   CHOL 84 03/05/2015 0840   TRIG 97.0 03/05/2015 0840   HDL 26.70* 03/05/2015 0840   CHOLHDL 3 03/05/2015 0840   VLDL 19.4 03/05/2015 0840   LDLCALC 38 03/05/2015 0840      Wt Readings from Last 3 Encounters:  03/01/16 204 lb 6.4 oz (92.715 kg)  09/01/15 198 lb 12.8 oz (90.175 kg)  03/05/15 233 lb 3.2 oz (105.779 kg)      Other studies Reviewed: Additional studies/ records that were reviewed today inclhospital records reviewed, lab work reviewed, catheterization report reviewed. Review of the above records demonstrates: As above   ASSESSMENT AND PLAN:  1. Non-ST elevation myocardial infarction-DES obtuse marginal 12/10/14. Took DAPT for one year. Off Brilinta. Has residual mild nonobstructive coronary artery disease in other vessels. Overall doing very well with no anginal symptoms.Completed cardiac rehabilitation.We will stop isosorbide 30 mg. He's not been having any anginal symptoms. He will let us know if this changes. ASA life long.  2. Hyperlipidemia-LDL 207 originally. Now 38. On atorvastatin 40 mg. excellent high intensity statin use. No issues.  3. Overweight-excellent job with weight loss, 70 pounds. Was 270 pounds. Continue walking. Fantastic job. Congratulated. A few pounds came back after his flu episode. Overall he is doing great.  4. Essential hypertension-continue with current medications. Low-dose beta blocker noted. amlodipine.   medicines are reviewed at length with the patient today.  The patient does not have concerns regarding medicines.  The following changes have been made:  no change  Labs/ tests ordered today include: .     Orders Placed This Encounter  Procedures  . EKG 12-Lead     Disposition:   FU with Skains in 12 months  Signed, Donato SchultzMark Skains, MD  03/01/2016 9:00 AM    [facility] [address], [place], [place]  [address] Phone: [phone](336) [phone];  Fax: [phone]

## 2016-03-02 ENCOUNTER — Other Ambulatory Visit: Payer: Self-pay | Admitting: Cardiology

## 2016-06-30 ENCOUNTER — Other Ambulatory Visit: Payer: Self-pay | Admitting: Cardiology

## 2016-09-17 ENCOUNTER — Other Ambulatory Visit: Payer: Self-pay | Admitting: Cardiology

## 2017-02-24 ENCOUNTER — Other Ambulatory Visit: Payer: Self-pay | Admitting: Cardiology

## 2017-03-01 ENCOUNTER — Ambulatory Visit: Payer: BLUE CROSS/BLUE SHIELD | Admitting: Cardiology

## 2017-03-06 ENCOUNTER — Encounter: Payer: Self-pay | Admitting: Cardiology

## 2017-03-23 ENCOUNTER — Other Ambulatory Visit: Payer: Self-pay | Admitting: Cardiology

## 2017-03-27 ENCOUNTER — Ambulatory Visit: Payer: BLUE CROSS/BLUE SHIELD | Admitting: Cardiology

## 2017-04-24 ENCOUNTER — Other Ambulatory Visit: Payer: Self-pay | Admitting: Cardiology

## 2017-05-24 ENCOUNTER — Other Ambulatory Visit: Payer: Self-pay | Admitting: Cardiology

## 2017-06-11 ENCOUNTER — Other Ambulatory Visit: Payer: Self-pay | Admitting: Cardiology

## 2017-06-27 ENCOUNTER — Other Ambulatory Visit: Payer: Self-pay | Admitting: Cardiology

## 2017-07-04 ENCOUNTER — Other Ambulatory Visit: Payer: Self-pay | Admitting: Cardiology

## 2017-07-09 ENCOUNTER — Other Ambulatory Visit: Payer: Self-pay | Admitting: *Deleted

## 2017-07-09 ENCOUNTER — Telehealth: Payer: Self-pay | Admitting: Cardiology

## 2017-07-09 MED ORDER — METOPROLOL TARTRATE 25 MG PO TABS: 25.0000 mg | ORAL_TABLET | Freq: Two times a day (BID) | ORAL | 0 refills | Status: DC

## 2017-07-09 NOTE — Telephone Encounter (Signed)
New message      *STAT* If patient is at the pharmacy, call can be transferred to refill team.   1. Which medications need to be refilled? (please list name of each medication and dose if known)  metoprolol tartrate (LOPRESSOR) 25 MG tablet TAKE 1 TABLET(25 MG) BY MOUTH TWICE DAILY     2. Which pharmacy/location (including street and city if local pharmacy) is medication to be sent to? [facility] [address] [place] [place]  3. Do they need a 30 day or 90 day supply? 30

## 2017-07-11 ENCOUNTER — Other Ambulatory Visit: Payer: Self-pay | Admitting: Cardiology

## 2017-07-23 NOTE — Progress Notes (Signed)
Cardiology Office Note    Date:  07/24/2017   ID:  Matthew Walls, DOB [birth year], MRN [id number]  PCP:  System, Provider Not In  Cardiologist:  Dr. Anne Fu  Chief Complaint: Yearly follow up for CAD  History of Present Illness:   Matthew Walls is a 61 y.o. male with history of CAD s/p MI in March 2016 which treated with DES to circumflex, hypertension, hyperlipidemia and psoriasis present for yearly follow-up.  He was doing well on cardiac stand point when last seen by Dr. Anne Fu 03/01/16.  Here for yearly follow up. No complains. Her wife suffered stroke this year. Was busy taking care of her. Currently walks 30-45 minutes/day without angina or dyspnea. No Palpitations, dizziness, orthopnea, PND, syncope, lower extremity edema, melena or blood in his stool or urine. Compliant with medication.  Past Medical History:  Diagnosis Date  . CAD (coronary artery disease)    cath 12/10/2014 occluded LCx treated with PTCA and DES (3.034 mm Resolute DES stent postdilated 3.23 mm)  . GERD (gastroesophageal reflux disease)   . Headache   . History of kidney stones   . Hypertension   . NSTEMI (non-ST elevated myocardial infarction) (HCC) 12/09/2014  . Psoriasis     Past Surgical History:  Procedure Laterality Date  . FINGER SURGERY Right   . LEFT HEART CATHETERIZATION WITH CORONARY ANGIOGRAM N/A 12/10/2014   Procedure: LEFT HEART CATHETERIZATION WITH CORONARY ANGIOGRAM;  Surgeon: Lennette Bihari, MD;  Location: [facility] CATH LAB;  Service: Cardiovascular;  Laterality: N/A;  . PERCUTANEOUS CORONARY STENT INTERVENTION (PCI-S)  12/10/2014   Procedure: PERCUTANEOUS CORONARY STENT INTERVENTION (PCI-S);  Surgeon: Lennette Bihari, MD;  Location: [facility] CATH LAB;  Service: Cardiovascular;;  Mid Circ Resolute 3x34  . RHINOPLASTY      Current Medications: Prior to Admission medications   Medication Sig Start Date End Date Taking? Authorizing Provider  amLODipine (NORVASC) 10 MG tablet TAKE 1 TABLET BY MOUTH  DAILY 07/12/17   Jake Bathe, MD  aspirin EC 81 MG EC tablet Take 1 tablet (81 mg total) by mouth daily. 12/11/14   Azalee Course, PA  atorvastatin (LIPITOR) 40 MG tablet TAKE 1 TABLET BY MOUTH DAILY AT 6:00 PM 07/12/17   Jake Bathe, MD  ferrous sulfate 325 (65 FE) MG tablet Take 1 tablet by mouth daily. 01/30/16   [provider]  metoprolol tartrate (LOPRESSOR) 25 MG tablet Take 1 tablet (25 mg total) by mouth 2 (two) times daily. MUST ATTEND APPOINTMENT FOR FURTHER REFILLS 07/09/17   Jake Bathe, MD  nitroGLYCERIN (NITROSTAT) 0.4 MG SL tablet Place 0.4 mg under the tongue every 5 (five) minutes as needed for chest pain (x 3 doses daily).    [provider]  Vitamin D, Ergocalciferol, (DRISDOL) 50000 UNITS CAPS capsule Take 50,000 Units by mouth every 7 (seven) days.  02/07/15   [provider]    Allergies:   Peanut-containing drug products   Social History   Social History  . Marital status: Married    Spouse name: N/A  . Number of children: N/A  . Years of education: N/A   Social History Main Topics  . Smoking status: Former Games developer  . Smokeless tobacco: Never Used  . Alcohol use No  . Drug use: No  . Sexual activity: Not Asked   Other Topics Concern  . None   Social History Narrative  . None     Family History:  The patient's family history includes CVA in his  mother.   ROS:   Please see the history of present illness.    ROS All other systems reviewed and are negative.   PHYSICAL EXAM:   VS:  BP 140/84   Pulse (!) 58   Ht 5\' 9"  (1.753 m)   Wt 247 lb 8 oz (112.3 kg)   SpO2 97%   BMI 36.55 kg/m    GEN: Well nourished, well developed, in no acute distress  HEENT: normal  Neck: no JVD, carotid bruits, or masses Cardiac:RRR; no murmurs, rubs, or gallops,no edema  Respiratory:  clear to auscultation bilaterally, normal work of breathing GI: soft, nontender, nondistended, + BS MS: no deformity or atrophy  Skin: warm and dry, no  rash Neuro:  Alert and Oriented x 3, Strength and sensation are intact Psych: euthymic mood, full affect  Wt Readings from Last 3 Encounters:  07/24/17 247 lb 8 oz (112.3 kg)  03/01/16 204 lb 6.4 oz (92.7 kg)  09/01/15 198 lb 12.8 oz (90.2 kg)      Studies/Labs Reviewed:   EKG:  EKG is not ordered today.   Recent Labs: No results found for requested labs within last 8760 hours.   Lipid Panel    Component Value Date/Time   CHOL 84 03/05/2015 0840   TRIG 97.0 03/05/2015 0840   HDL 26.70 (L) 03/05/2015 0840   CHOLHDL 3 03/05/2015 0840   VLDL 19.4 03/05/2015 0840   LDLCALC 38 03/05/2015 0840    Additional studies/ records that were reviewed today include:   Cardiac Catheterization: 12/10/14 ANGIOGRAPHY:   The left main coronary artery was a very short vessel which immediately bifurcated into the LAD and left circumflex vessel  The LAD was a moderate size vessel that gave rise to a large first diagonal and first septal vessel.  There was mild 30% narrowing in the LAD after this large septal perforating artery.  The LAD extended to the apex and was otherwise free of significant disease.  The left circumflex coronary artery had 30% ostial narrowing followed by 40-50% proximal stenosis.  There was any 70% stenosis before a diminutive branch.  The vessel was then occluded in the AV groove.  There was a skip leshion and beyond this was faint filling with < 1TIMI flow of an obtuse marginal and distal AV groove circumflex.  The RCA was a moderate size vessel that a 20% proximal narrowing.  There are tandem 50 and 60% stenoses in the mid RCA.  There was 30% narrowing beyond the acute margin and 60% stenosis in the distal RCA extending into the PDA at the arch and of the continuation branch.  This was smooth and eccentric.  There was very faint collaterals to very small distal vessels of the circumflex.  Left ventriculography revealed low normal LV function with an ejection fraction  of 50%. There was mild mid inferior hypocontractility.  Following PCI of the totally occluded AV groove circumflex with segmental lesions into the obtuse marginal vessel, treated with PTCA, and ultimate DES stenting with a Resolute 3.034 mm stent postdilated to 3.23 mm, the 70% stenosis was reduced to 0%, 100% occlusion to 0% and a 70% stenoses in the obtuse marginal vessel reduced to 0%.  Beyond the stent, the vessel tapered and gave rise to 3 additional branches.    IMPRESSION:  Non-ST segment elevation myocardial infarction secondary to total occlusion of the left circumflex vessel.  Three-vessel CAD with smooth 30% narrowing in the LAD, 30% ostial, 40-50% proximal followed by 70% AV  groove circumflex stenosis prior to being totally occluded, and moderate RCA stenoses of 50-60% in the mid segment, 30% beyond the acute margin and 60% in the region of the PDA takeoff.  Successful percutaneous cardiac intervention of the left circumflex coronary artery with PTCA and DES stenting of the AV groove circumflex, and several sites in the obtuse 1 marginal vessel with insertion of a 3.034 mm Resolute DES stent postdilated 3.23 mm with the entire stented region reduced to 0%.  There is resumption of brisk TIMI-3 flow.   RECOMMENDATION:  Medical therapy for concomitant CAD.  Patient will continue with dual antiplatelet therapy for minimum of a year and may benefit from long-term treatment beyond a year based on Pegasys trial data at areduce Brilinta dose.  Aggressive statin therapy will be implemented.    ASSESSMENT & PLAN:    1. CAD s/p DES to OM 12/10/14 - No angina. Continue aspirin, statin and beta blocker  2. HTN - Relatively controlled on Lopressor 25 mg twice a day. Advised to keep log.   3. HLD - Continue statin. Lipids managed by PCP. LDL goal less than 70.    Medication Adjustments/Labs and Tests Ordered: Current medicines are reviewed at length with the patient today.   Concerns regarding medicines are outlined above.  Medication changes, Labs and Tests ordered today are listed in the Patient Instructions below. There are no Patient Instructions on file for this visit.   Lorelei PontSigned, Josealberto Montalto, PA  07/24/2017 8:16 AM    [facility] [address], [place], [place]  [address] Phone: [phone]; Fax: [phone]

## 2017-07-24 ENCOUNTER — Other Ambulatory Visit: Payer: Self-pay | Admitting: Cardiology

## 2017-07-24 ENCOUNTER — Ambulatory Visit (INDEPENDENT_AMBULATORY_CARE_PROVIDER_SITE_OTHER): Payer: BLUE CROSS/BLUE SHIELD | Admitting: Physician Assistant

## 2017-07-24 ENCOUNTER — Encounter: Payer: Self-pay | Admitting: Physician Assistant

## 2017-07-24 VITALS — BP 140/84 | HR 58 | Ht 69.0 in | Wt 247.5 lb

## 2017-07-24 DIAGNOSIS — I2510 Atherosclerotic heart disease of native coronary artery without angina pectoris: Secondary | ICD-10-CM

## 2017-07-24 DIAGNOSIS — I10 Essential (primary) hypertension: Secondary | ICD-10-CM | POA: Diagnosis not present

## 2017-07-24 DIAGNOSIS — E785 Hyperlipidemia, unspecified: Secondary | ICD-10-CM | POA: Diagnosis not present

## 2017-07-24 NOTE — Patient Instructions (Signed)
Medication Instructions: Your physician recommends that you continue on your current medications as directed. Please refer to the Current Medication list given to you today.  Labwork: None Ordered  Procedures/Testing: None Ordered  Follow-Up: Your physician wants you to follow-up in: 1 YEAR with Dr. Skains. You will receive a reminder letter in the mail two months in advance. If you don't receive a letter, please call our office to schedule the follow-up appointment.   If you need a refill on your cardiac medications before your next appointment, please call your pharmacy.    

## 2017-08-02 ENCOUNTER — Other Ambulatory Visit: Payer: Self-pay | Admitting: Cardiology

## 2017-11-25 DIAGNOSIS — J40 Bronchitis, not specified as acute or chronic: Secondary | ICD-10-CM | POA: Diagnosis not present

## 2017-12-24 DIAGNOSIS — J019 Acute sinusitis, unspecified: Secondary | ICD-10-CM | POA: Diagnosis not present

## 2017-12-24 DIAGNOSIS — R05 Cough: Secondary | ICD-10-CM | POA: Diagnosis not present

## 2018-08-19 ENCOUNTER — Other Ambulatory Visit: Payer: Self-pay | Admitting: Cardiology

## 2018-09-20 ENCOUNTER — Other Ambulatory Visit: Payer: Self-pay | Admitting: Cardiology

## 2018-10-18 ENCOUNTER — Other Ambulatory Visit: Payer: Self-pay | Admitting: Cardiology

## 2018-10-19 DIAGNOSIS — J069 Acute upper respiratory infection, unspecified: Secondary | ICD-10-CM | POA: Diagnosis not present

## 2018-11-06 ENCOUNTER — Other Ambulatory Visit: Payer: Self-pay | Admitting: Cardiology

## 2018-12-11 DIAGNOSIS — J019 Acute sinusitis, unspecified: Secondary | ICD-10-CM | POA: Diagnosis not present

## 2019-06-23 ENCOUNTER — Telehealth: Payer: Self-pay

## 2019-06-23 NOTE — Telephone Encounter (Signed)
I spoke to pt who gave me consent for virtual visit on Wed, 06/25/19. I reviewed meds and made him aware that we would be calling him 11mins prior to his appt time to obtain his vitals. Pt verbalized understanding of our conversation and thanked me for the call....jb

## 2019-06-24 NOTE — Progress Notes (Signed)
Virtual Visit via Telephone Note   This visit type was conducted due to national recommendations for restrictions regarding the COVID-19 Pandemic (e.g. social distancing) in an effort to limit this patient's exposure and mitigate transmission in our community.  Due to his co-morbid illnesses, this patient is at least at moderate risk for complications without adequate follow up.  This format is felt to be most appropriate for this patient at this time.  The patient did not have access to video technology/had technical difficulties with video requiring transitioning to audio format only (telephone).  All issues noted in this document were discussed and addressed.  No physical exam could be performed with this format.  Please refer to the patient's chart for his  consent to telehealth for [facility]   Date:  06/25/2019   ID:  Matthew Walls, DOB 1955-12-28, MRN [id number]  Patient Location: Home Provider Location: Home  PCP:  System, Provider Not In  Cardiologist:  Donato Schultz, MD   Evaluation Performed:  Follow-Up Visit  Chief Complaint: Follow-up for CAD, seen for Dr. Anne Fu  History of Present Illness:    Matthew Walls is a 63 y.o. male with a history of CAD s/p MI 12/2014 treated with DES to circumflex.  Also with a history of hypertension, hyperlipidemia and psoriasis, seen for Dr. Anne Fu for yearly follow-up  Last seen by Dr. Anne Fu 03/01/2016 is doing well from a cardiac standpoint. On 07/24/2017 patient was seen with no complaints. 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 They continue to walk 30 to 45 minutes/day without angina or dyspnea.  He was compliant with his medications.  Patient is doing very well today from a cardiac perspective.  Denies anginal symptoms, shortness of breath, LE swelling, orthopnea, palpitations, dizziness, fatigue or syncope.  Needs medication refills otherwise is doing very well.  BP stable today at  133/90.    The patient does not have symptoms concerning for COVID-19 infection (fever, chills, cough, or new shortness of breath).    Past Medical History:  Diagnosis Date  . CAD (coronary artery disease)    cath 12/10/2014 occluded LCx treated with PTCA and DES (3.034 mm Resolute DES stent postdilated 3.23 mm)  . GERD (gastroesophageal reflux disease)   . Headache   . History of kidney stones   . Hypertension   . NSTEMI (non-ST elevated myocardial infarction) (HCC) 12/09/2014  . Psoriasis    Past Surgical History:  Procedure Laterality Date  . FINGER SURGERY Right   . LEFT HEART CATHETERIZATION WITH CORONARY ANGIOGRAM N/A 12/10/2014   Procedure: LEFT HEART CATHETERIZATION WITH CORONARY ANGIOGRAM;  Surgeon: Lennette Bihari, MD;  Location: [facility] CATH LAB;  Service: Cardiovascular;  Laterality: N/A;  . PERCUTANEOUS CORONARY STENT INTERVENTION (PCI-S)  12/10/2014   Procedure: PERCUTANEOUS CORONARY STENT INTERVENTION (PCI-S);  Surgeon: Lennette Bihari, MD;  Location: [facility] CATH LAB;  Service: Cardiovascular;;  Mid Circ Resolute 3x34  . RHINOPLASTY       No outpatient medications have been marked as taking for the 06/25/19 encounter (Telemedicine) with Filbert Schilder, NP.     Allergies:   Peanut-containing drug products   Social History   Tobacco Use  . Smoking status: Former Games developer  . Smokeless tobacco: Never Used  Substance Use Topics  . Alcohol use: No  . Drug use: No     Family Hx: The patient's family history includes CVA in his mother.  ROS:  Please see the history of present illness.     All other systems reviewed and are negative.  Prior CV studies:   The following studies were reviewed today:  Cardiac Catheterization: 12/10/14 ANGIOGRAPHY:  The left main coronary artery was a very short vessel which immediately bifurcated into the LAD and left circumflex vessel  The LAD was a moderate size vessel that gave rise to a large first diagonal and first septal vessel.  There was mild 30% narrowing in the LAD after this large septal perforating artery. The LAD extended to the apex and was otherwise free of significant disease.  The left circumflex coronary artery had 30% ostial narrowing followed by 40-50% proximal stenosis. There was any 70% stenosis before a diminutive branch. The vessel was then occluded in the AV groove. There was a skip leshion and beyond this was faint filling with <1TIMI flow of an obtuse marginal and distal AV groove circumflex.  The RCA was a moderate size vessel that a 20% proximal narrowing. There are tandem 50 and 60% stenoses in the mid RCA. There was 30% narrowing beyond the acute margin and 60% stenosis in the distal RCA extending into the PDA at the arch and of the continuation branch. This was smooth and eccentric. There was very faint collaterals to very small distal vessels of the circumflex.  Left ventriculography revealed low normal LV function with an ejection fraction of 50%. There was mild mid inferior hypocontractility.  Following PCI of the totally occluded AV groove circumflex with segmental lesions into the obtuse marginal vessel, treated with PTCA, and ultimate DES stenting with a Resolute 3.034 mm stent postdilated to 3.23 mm, the 70% stenosis was reduced to 0%, 100% occlusion to 0% and a 70% stenoses in the obtuse marginal vessel reduced to 0%. Beyond the stent, the vessel tapered and gave rise to 3 additional branches.   IMPRESSION:  Non-ST segment elevation myocardial infarction secondary to total occlusion of the left circumflex vessel.  Three-vessel CAD with smooth 30% narrowing in the LAD, 30% ostial, 40-50% proximal followed by 70% AV groove circumflex stenosis prior to being totally occluded, and moderate RCA stenoses of 50-60% in the mid segment, 30% beyond the acute margin and 60% in the region of the PDA takeoff.  Successful percutaneous cardiac intervention of the left circumflex  coronary artery with PTCA and DES stenting of the AV groove circumflex, and several sites in the obtuse 1 marginal vessel with insertion of a 3.034 mm Resolute DES stent postdilated 3.23 mm with the entire stented region reduced to 0%. There is resumption of brisk TIMI-3 flow.   RECOMMENDATION:  Medical therapy for concomitant CAD. Patient will continue with dual antiplatelet therapy for minimum of a year and may benefit from long-term treatment beyond a year based on Pegasys trial data at areduce Brilinta dose. Aggressive statin therapy will be implemented  Labs/Other Tests and Data Reviewed:    EKG:  An ECG dated 01/15/2017 was personally reviewed today and demonstrated: NSR.  Needs updated EKG at next office visit  Recent Labs: No results found for requested labs within last 8760 hours.   Recent Lipid Panel Lab Results  Component Value Date/Time   CHOL 84 03/05/2015 08:40 AM   TRIG 97.0 03/05/2015 08:40 AM   HDL 26.70 (L) 03/05/2015 08:40 AM   CHOLHDL 3 03/05/2015 08:40 AM   LDLCALC 38 03/05/2015 08:40 AM    Wt Readings from Last 3 Encounters:  06/25/19 210 lb (95.3 kg)  07/24/17 247 lb  8 oz (112.3 kg)  03/01/16 204 lb 6.4 oz (92.7 kg)     Objective:    Vital Signs:  BP 133/90   Pulse 70   Ht 5\' 9"  (1.753 m)   Wt 210 lb (95.3 kg)   BMI 31.01 kg/m    VITAL SIGNS:  reviewed GEN:  no acute distress RESPIRATORY:  normal respiratory effort NEURO:  alert and oriented x 3, no obvious focal deficit PSYCH:  normal affect  ASSESSMENT & PLAN:    1.  CAD s/p DES to OM 12/10/2014: -No anginal symptoms -Continue ASA, statin and beta-blocker   2.  Hypertension: -Stable, 133/90 -Continue Lopressor 25  3.  HLD: -Last LDL, 38 -Continue statin  -Patient reports lipids managed by PCP -LDL goal < 70   COVID-19 Education: The signs and symptoms of COVID-19 were discussed with the patient and how to seek care for testing (follow up with PCP or arrange E-visit).  The  importance of social distancing was discussed today.  Time:   Today, I have spent 15 minutes with the patient with telehealth technology discussing the above problems.     Medication Adjustments/Labs and Tests Ordered: Current medicines are reviewed at length with the patient today.  Concerns regarding medicines are outlined above.   Tests Ordered: No orders of the defined types were placed in this encounter.   Medication Changes: No orders of the defined types were placed in this encounter.   Follow Up:  Virtual Visit or In Person Dr. 02/09/2015 in 9 to 12 months  Signed, Anne Fu, NP  06/25/2019 10:57 AM    [facility]

## 2019-06-25 ENCOUNTER — Telehealth (INDEPENDENT_AMBULATORY_CARE_PROVIDER_SITE_OTHER): Payer: BC Managed Care – PPO | Admitting: Cardiology

## 2019-06-25 ENCOUNTER — Other Ambulatory Visit: Payer: Self-pay

## 2019-06-25 ENCOUNTER — Encounter: Payer: Self-pay | Admitting: Cardiology

## 2019-06-25 VITALS — BP 133/90 | HR 70 | Ht 69.0 in | Wt 210.0 lb

## 2019-06-25 DIAGNOSIS — E785 Hyperlipidemia, unspecified: Secondary | ICD-10-CM

## 2019-06-25 DIAGNOSIS — I10 Essential (primary) hypertension: Secondary | ICD-10-CM

## 2019-06-25 DIAGNOSIS — I2510 Atherosclerotic heart disease of native coronary artery without angina pectoris: Secondary | ICD-10-CM | POA: Diagnosis not present

## 2019-06-25 MED ORDER — ATORVASTATIN CALCIUM 40 MG PO TABS: 40.0000 mg | ORAL_TABLET | Freq: Every day | ORAL | 3 refills | Status: DC

## 2019-06-25 MED ORDER — AMLODIPINE BESYLATE 10 MG PO TABS: 10.0000 mg | ORAL_TABLET | Freq: Every day | ORAL | 3 refills | Status: DC

## 2019-06-25 MED ORDER — NITROGLYCERIN 0.4 MG SL SUBL: 0.4000 mg | SUBLINGUAL_TABLET | SUBLINGUAL | 5 refills | Status: DC | PRN

## 2019-06-25 MED ORDER — METOPROLOL TARTRATE 25 MG PO TABS: 25.0000 mg | ORAL_TABLET | Freq: Two times a day (BID) | ORAL | 3 refills | Status: DC

## 2019-06-25 NOTE — Patient Instructions (Signed)
Medication Instructions:  Your physician recommends that you continue on your current medications as directed. Please refer to the Current Medication list given to you today.  If you need a refill on your cardiac medications before your next appointment, please call your pharmacy.   Lab work: NONE If you have labs (blood work) drawn today and your tests are completely normal, you will receive your results only by: Marland Kitchen MyChart Message (if you have MyChart) OR . A paper copy in the mail If you have any lab test that is abnormal or we need to change your treatment, we will call you to review the results.  Testing/Procedures: NONE  Follow-Up: At [facility], you and your health needs are our priority.  As part of our continuing mission to provide you with exceptional heart care, we have created designated Provider Care Teams.  These Care Teams include your primary Cardiologist (physician) and Advanced Practice Providers (APPs -  Physician Assistants and Nurse Practitioners) who all work together to provide you with the care you need, when you need it. You will need a follow up appointment in 9-12 months.  Please call our office 2 months in advance to schedule this appointment.  You may see Candee Furbish, MD or one of the following Advanced Practice Providers on your designated Care Team:   Truitt Merle, NP Cecilie Kicks, NP . Kathyrn Drown, NP

## 2019-06-25 NOTE — Addendum Note (Signed)
Addended by: Jacinta Shoe on: 06/25/2019 11:03 AM   Modules accepted: Orders

## 2019-07-07 ENCOUNTER — Telehealth: Payer: BLUE CROSS/BLUE SHIELD | Admitting: Cardiology

## 2019-07-09 ENCOUNTER — Emergency Department (HOSPITAL_BASED_OUTPATIENT_CLINIC_OR_DEPARTMENT_OTHER)
Admission: EM | Admit: 2019-07-09 | Discharge: 2019-07-09 | Disposition: A | Discharge status: Home / Self Care | Payer: BC Managed Care – PPO | Attending: Emergency Medicine | Admitting: Emergency Medicine

## 2019-07-09 ENCOUNTER — Other Ambulatory Visit: Payer: Self-pay

## 2019-07-09 ENCOUNTER — Emergency Department (HOSPITAL_BASED_OUTPATIENT_CLINIC_OR_DEPARTMENT_OTHER): Payer: BC Managed Care – PPO

## 2019-07-09 ENCOUNTER — Encounter (HOSPITAL_BASED_OUTPATIENT_CLINIC_OR_DEPARTMENT_OTHER): Payer: Self-pay | Admitting: Emergency Medicine

## 2019-07-09 DIAGNOSIS — Z79899 Other long term (current) drug therapy: Secondary | ICD-10-CM | POA: Insufficient documentation

## 2019-07-09 DIAGNOSIS — Z955 Presence of coronary angioplasty implant and graft: Secondary | ICD-10-CM | POA: Diagnosis not present

## 2019-07-09 DIAGNOSIS — I252 Old myocardial infarction: Secondary | ICD-10-CM | POA: Diagnosis not present

## 2019-07-09 DIAGNOSIS — Z87891 Personal history of nicotine dependence: Secondary | ICD-10-CM | POA: Diagnosis not present

## 2019-07-09 DIAGNOSIS — I2510 Atherosclerotic heart disease of native coronary artery without angina pectoris: Secondary | ICD-10-CM | POA: Insufficient documentation

## 2019-07-09 DIAGNOSIS — R079 Chest pain, unspecified: Secondary | ICD-10-CM | POA: Diagnosis present

## 2019-07-09 DIAGNOSIS — Z91010 Allergy to peanuts: Secondary | ICD-10-CM | POA: Diagnosis not present

## 2019-07-09 DIAGNOSIS — R0789 Other chest pain: Secondary | ICD-10-CM | POA: Diagnosis not present

## 2019-07-09 DIAGNOSIS — I493 Ventricular premature depolarization: Secondary | ICD-10-CM | POA: Insufficient documentation

## 2019-07-09 DIAGNOSIS — I10 Essential (primary) hypertension: Secondary | ICD-10-CM | POA: Insufficient documentation

## 2019-07-09 DIAGNOSIS — Z7982 Long term (current) use of aspirin: Secondary | ICD-10-CM | POA: Diagnosis not present

## 2019-07-09 DIAGNOSIS — R072 Precordial pain: Secondary | ICD-10-CM | POA: Diagnosis not present

## 2019-07-09 LAB — BASIC METABOLIC PANEL
Anion gap: 12 (ref 5–15)
BUN: 27 mg/dL — ABNORMAL HIGH (ref 8–23)
CO2: 27 mmol/L (ref 22–32)
Calcium: 9.4 mg/dL (ref 8.9–10.3)
Chloride: 100 mmol/L (ref 98–111)
Creatinine, Ser: 0.98 mg/dL (ref 0.61–1.24)
GFR calc Af Amer: >60 mL/min (ref >60)
GFR calc non Af Amer: >60 mL/min (ref >60)
Glucose, Bld: 97 mg/dL (ref 70–99)
Potassium: 3.5 mmol/L (ref 3.5–5.1)
Sodium: 139 mmol/L (ref 135–145)

## 2019-07-09 LAB — TROPONIN I (HIGH SENSITIVITY)
Troponin I (High Sensitivity): 11 ng/L (ref <18)
Troponin I (High Sensitivity): 11 ng/L (ref <18)

## 2019-07-09 LAB — HEPATIC FUNCTION PANEL
ALT: 62 U/L — ABNORMAL HIGH (ref 0–44)
AST: 67 U/L — ABNORMAL HIGH (ref 15–41)
Albumin: 3.9 g/dL (ref 3.5–5.0)
Alkaline Phosphatase: 78 U/L (ref 38–126)
Bilirubin, Direct: 0.1 mg/dL (ref 0.0–0.2)
Indirect Bilirubin: 0.5 mg/dL (ref 0.3–0.9)
Total Bilirubin: 0.6 mg/dL (ref 0.3–1.2)
Total Protein: 7.9 g/dL (ref 6.5–8.1)

## 2019-07-09 LAB — CBC
HCT: 47.5 % (ref 39.0–52.0)
Hemoglobin: 15.6 g/dL (ref 13.0–17.0)
MCH: 29.2 pg (ref 26.0–34.0)
MCHC: 32.8 g/dL (ref 30.0–36.0)
MCV: 89 fL (ref 80.0–100.0)
Platelets: 246 10*3/uL (ref 150–400)
RBC: 5.34 MIL/uL (ref 4.22–5.81)
RDW: 13.3 % (ref 11.5–15.5)
WBC: 13.8 10*3/uL — ABNORMAL HIGH (ref 4.0–10.5)
nRBC: 0 % (ref 0.0–0.2)

## 2019-07-09 LAB — MAGNESIUM: Magnesium: 2.4 mg/dL (ref 1.7–2.4)

## 2019-07-09 LAB — LIPASE, BLOOD: Lipase: 36 U/L (ref 11–51)

## 2019-07-09 NOTE — Discharge Instructions (Signed)
Your work-up today was overall reassuring but we did see evidence of multiple PVCs (the irregular heartbeats).  We had a shared decision-making conversation and agreed that your symptoms were not consistent with your prior cardiac pain as it was better with exertion and your work-up was overall reassuring otherwise.  We agreed to have you follow-up with your PCP and your cardiologist and return if any symptoms change or worsen.

## 2019-07-09 NOTE — ED Provider Notes (Signed)
[facility] EMERGENCY DEPARTMENT Provider Note   CSN: [id number] Arrival date & time: 07/09/19  1859     History   Chief Complaint Chief Complaint  Patient presents with  . Chest Pain    HPI Matthew Walls is a 63 y.o. male.     The history is provided by the patient and medical records. No language interpreter was used.  Chest Pain Pain location:  Substernal area Pain quality: aching and dull   Pain radiates to:  Does not radiate Pain severity:  Mild Onset quality:  Gradual Duration:  5 hours Timing:  Constant Progression:  Waxing and waning Chronicity:  New Relieved by: exertion. Worsened by:  Nothing Ineffective treatments:  None tried Associated symptoms: no abdominal pain, no altered mental status, no anorexia, no anxiety, no back pain, no claudication, no cough, no diaphoresis, no dizziness, no dysphagia, no fatigue, no fever, no headache, no lower extremity edema, no nausea, no numbness, no palpitations, no shortness of breath, no vomiting and no weakness   Risk factors: coronary artery disease, hypertension and male sex   Risk factors: no prior DVT/PE     Past Medical History:  Diagnosis Date  . CAD (coronary artery disease)    cath 12/10/2014 occluded LCx treated with PTCA and DES (3.034 mm Resolute DES stent postdilated 3.23 mm)  . GERD (gastroesophageal reflux disease)   . Headache   . History of kidney stones   . Hypertension   . NSTEMI (non-ST elevated myocardial infarction) (HCC) 12/09/2014  . Psoriasis     Patient Active Problem List   Diagnosis Date Noted  . Old MI (myocardial infarction) 09/01/2015  . Coronary artery disease due to lipid rich plaque 09/01/2015  . Overweight 09/01/2015  . Morbid obesity (HCC) 01/04/2015  . CAD (coronary artery disease) 12/11/2014  . Hyperlipidemia 12/11/2014  . Essential hypertension 12/11/2014  . NSTEMI (non-ST elevated myocardial infarction) (HCC) 12/10/2014    Past Surgical History:  Procedure  Laterality Date  . FINGER SURGERY Right   . LEFT HEART CATHETERIZATION WITH CORONARY ANGIOGRAM N/A 12/10/2014   Procedure: LEFT HEART CATHETERIZATION WITH CORONARY ANGIOGRAM;  Surgeon: Lennette Bihari, MD;  Location: [facility] CATH LAB;  Service: Cardiovascular;  Laterality: N/A;  . PERCUTANEOUS CORONARY STENT INTERVENTION (PCI-S)  12/10/2014   Procedure: PERCUTANEOUS CORONARY STENT INTERVENTION (PCI-S);  Surgeon: Lennette Bihari, MD;  Location: [facility] CATH LAB;  Service: Cardiovascular;;  Mid Circ Resolute 3x34  . RHINOPLASTY          Home Medications    Prior to Admission medications   Medication Sig Start Date End Date Taking? Authorizing Provider  amLODipine (NORVASC) 10 MG tablet Take 1 tablet (10 mg total) by mouth daily. 06/25/19   Georgie Chard D, NP  aspirin EC 81 MG EC tablet Take 1 tablet (81 mg total) by mouth daily. 12/11/14   Azalee Course, PA  atorvastatin (LIPITOR) 40 MG tablet Take 1 tablet (40 mg total) by mouth daily at 6 PM. 06/25/19   Georgie Chard D, NP  ferrous sulfate 325 (65 FE) MG tablet Take 1 tablet by mouth daily. 01/30/16   [provider]  metoprolol tartrate (LOPRESSOR) 25 MG tablet Take 1 tablet (25 mg total) by mouth 2 (two) times daily. 06/25/19   Filbert Schilder, NP  nitroGLYCERIN (NITROSTAT) 0.4 MG SL tablet Place 1 tablet (0.4 mg total) under the tongue every 5 (five) minutes as needed for chest pain (x 3 doses daily). 06/25/19   Filbert Schilder,  NP  Vitamin D, Ergocalciferol, (DRISDOL) 50000 UNITS CAPS capsule Take 50,000 Units by mouth every 7 (seven) days.  02/07/15   [provider]    Family History Family History  Problem Relation Age of Onset  . CVA Mother     Social History Social History   Tobacco Use  . Smoking status: Former Research scientist (life sciences)  . Smokeless tobacco: Never Used  Substance Use Topics  . Alcohol use: No  . Drug use: No     Allergies   Peanut-containing drug products   Review of Systems Review of Systems  Constitutional:  Negative for chills, diaphoresis, fatigue and fever.  HENT: Negative for congestion and trouble swallowing.   Eyes: Negative for visual disturbance.  Respiratory: Negative for cough, chest tightness, shortness of breath, wheezing and stridor.   Cardiovascular: Positive for chest pain. Negative for palpitations, claudication and leg swelling.  Gastrointestinal: Negative for abdominal pain, anorexia, constipation, diarrhea, nausea and vomiting.  Genitourinary: Negative for flank pain and frequency.  Musculoskeletal: Negative for back pain, neck pain and neck stiffness.  Skin: Negative for rash and wound.  Neurological: Negative for dizziness, weakness, light-headedness, numbness and headaches.  Psychiatric/Behavioral: Negative for agitation.  All other systems reviewed and are negative.    Physical Exam Updated Vital Signs BP (!) 167/79   Pulse 94   Temp 98.7 F (37.1 C) (Oral)   Resp (!) 22   Ht 5\' 9"  (1.753 m)   Wt 120.2 kg   SpO2 99%   BMI 39.13 kg/m   Physical Exam Vitals signs and nursing note reviewed.  Constitutional:      General: He is not in acute distress.    Appearance: He is well-developed. He is not ill-appearing, toxic-appearing or diaphoretic.  HENT:     Head: Normocephalic and atraumatic.  Eyes:     Extraocular Movements: Extraocular movements intact.     Conjunctiva/sclera: Conjunctivae normal.     Pupils: Pupils are equal, round, and reactive to light.  Neck:     Musculoskeletal: Neck supple.  Cardiovascular:     Rate and Rhythm: Normal rate and regular rhythm.     Heart sounds: No murmur.  Pulmonary:     Effort: Pulmonary effort is normal. No respiratory distress.     Breath sounds: Normal breath sounds. No decreased breath sounds, wheezing, rhonchi or rales.  Chest:     Chest wall: No tenderness.  Abdominal:     Palpations: Abdomen is soft.     Tenderness: There is no abdominal tenderness.  Musculoskeletal: Normal range of motion.     Right  lower leg: He exhibits no tenderness. No edema.     Left lower leg: He exhibits no tenderness. No edema.  Skin:    General: Skin is warm and dry.     Capillary Refill: Capillary refill takes less than 2 seconds.  Neurological:     General: No focal deficit present.     Mental Status: He is alert.  Psychiatric:        Mood and Affect: Mood normal.      ED Treatments / Results  Labs (all labs ordered are listed, but only abnormal results are displayed) Labs Reviewed  BASIC METABOLIC PANEL - Abnormal; Notable for the following components:      Result Value   BUN 27 (*)    All other components within normal limits  CBC - Abnormal; Notable for the following components:   WBC 13.8 (*)    All other components within  normal limits  HEPATIC FUNCTION PANEL - Abnormal; Notable for the following components:   AST 67 (*)    ALT 62 (*)    All other components within normal limits  LIPASE, BLOOD  MAGNESIUM  TROPONIN I (HIGH SENSITIVITY)  TROPONIN I (HIGH SENSITIVITY)    EKG EKG Interpretation  Date/Time:  Wednesday July 09 2019 19:07:39 EDT Ventricular Rate:  95 PR Interval:    QRS Duration: 90 QT Interval:  370 QTC Calculation: 466 R Axis:   52 Text Interpretation:  Sinus rhythm Ventricular trigeminy Probable left atrial enlargement When compared to prior, new PVC and trigeminy.  No STEMI Confirmed by Theda Belfast ([id number]) on 07/09/2019 7:27:57 PM   Radiology Dg Chest 2 View  Result Date: 07/09/2019 CLINICAL DATA:  Chest tightness. EXAM: CHEST - 2 VIEW COMPARISON:  12/10/2014 FINDINGS: The lungs are clear without focal pneumonia, edema, pneumothorax or pleural effusion. The cardiopericardial silhouette is within normal limits for size. The visualized bony structures of the thorax are intact. Telemetry leads overlie the chest. IMPRESSION: No active cardiopulmonary disease. Electronically Signed   By: Kennith Center M.D.   On: 07/09/2019 19:47    Procedures Procedures  (including critical care time)  Medications Ordered in ED Medications - No data to display   Initial Impression / Assessment and Plan / ED Course  I have reviewed the triage vital signs and the nursing notes.  Pertinent labs & imaging results that were available during my care of the patient were reviewed by me and considered in my medical decision making (see chart for details).        Matthew Walls is a 64 y.o. male with a past medical history sniffing for CAD status post PCI, GERD, hypertension, hyperlipidemia who presents with chest discomfort.  He reports that his chest pain feels different than any of his cardiac pain in the past.  He reports around 4 PM today, he started having a chest discomfort.  He describes as diffuse aching and tightness that is improved with exertion.  It is not exertional and is not pleuritic.  He denies shortness of breath, nausea vomiting, diaphoresis, or radiation of discomfort.  Denies recent trauma.  Reports the pain is 3 out of 10 at its worst.  He denies any palpitations or any recent medication changes.  He denies any history of DVT or PE.  Denies any leg pain or leg swelling.  No recent URI symptoms.  On exam, lungs are clear and chest is nontender.  Abdomen is nontender.  No murmur.  Legs are nonedematous and nontender.  Patient resting comfortably on room air.  EKG shows new PVCs and occasional trigeminy.  No STEMI.  Patient work-up including chest x-ray and labs and will likely have a shared decision made conversation to determine disposition afterwards.  10:34 PM Work-up showed improved but mild leukocytosis.  Troponin negative x2.  Electrolytes and other labs reassuring.  Chest x-ray reassuring.  Patient and I had a shared decision conversation and he does not want to be admitted.  Patient will follow-up with his PCP and cardiology team and understand return precautions.  He had no other questions or concerns and was discharged in good condition.   Final Clinical Impressions(s) / ED Diagnoses   Final diagnoses:  Precordial pain  PVC (premature ventricular contraction)    ED Discharge Orders    None      Clinical Impression: 1. Precordial pain   2. PVC (premature ventricular contraction)     Disposition:  Discharge  Condition: Good  I have discussed the results, Dx and Tx plan with the pt(& family if present). He/she/they expressed understanding and agree(s) with the plan. Discharge instructions discussed at great length. Strict return precautions discussed and pt &/or family have verbalized understanding of the instructions. No further questions at time of discharge.    New Prescriptions   No medications on file    Follow Up: Maye HidesMiller, Ryan Dean, [place] [address] [place] [id number] [phone]     [facility] [address] [place] [phone]    [facility] [address] [id number] [address] [id number] [address] [place] [id number] [phone]       Tegeler, Canary Brimhristopher J, MD 07/09/19 2235

## 2019-07-09 NOTE — ED Notes (Signed)
ED Provider at bedside. 

## 2019-07-09 NOTE — ED Triage Notes (Signed)
Chest tightness started today 1t 1630. PT ambulatory, no sob. Increased with lying down.

## 2020-06-07 IMAGING — CR DG CHEST 2V
3 series · 3 of 3 positions shown · non-contrast
Comparison: 12/10/2014

CLINICAL DATA: Chest tightness.

EXAM:
CHEST - 2 VIEW

[w chest pa *]
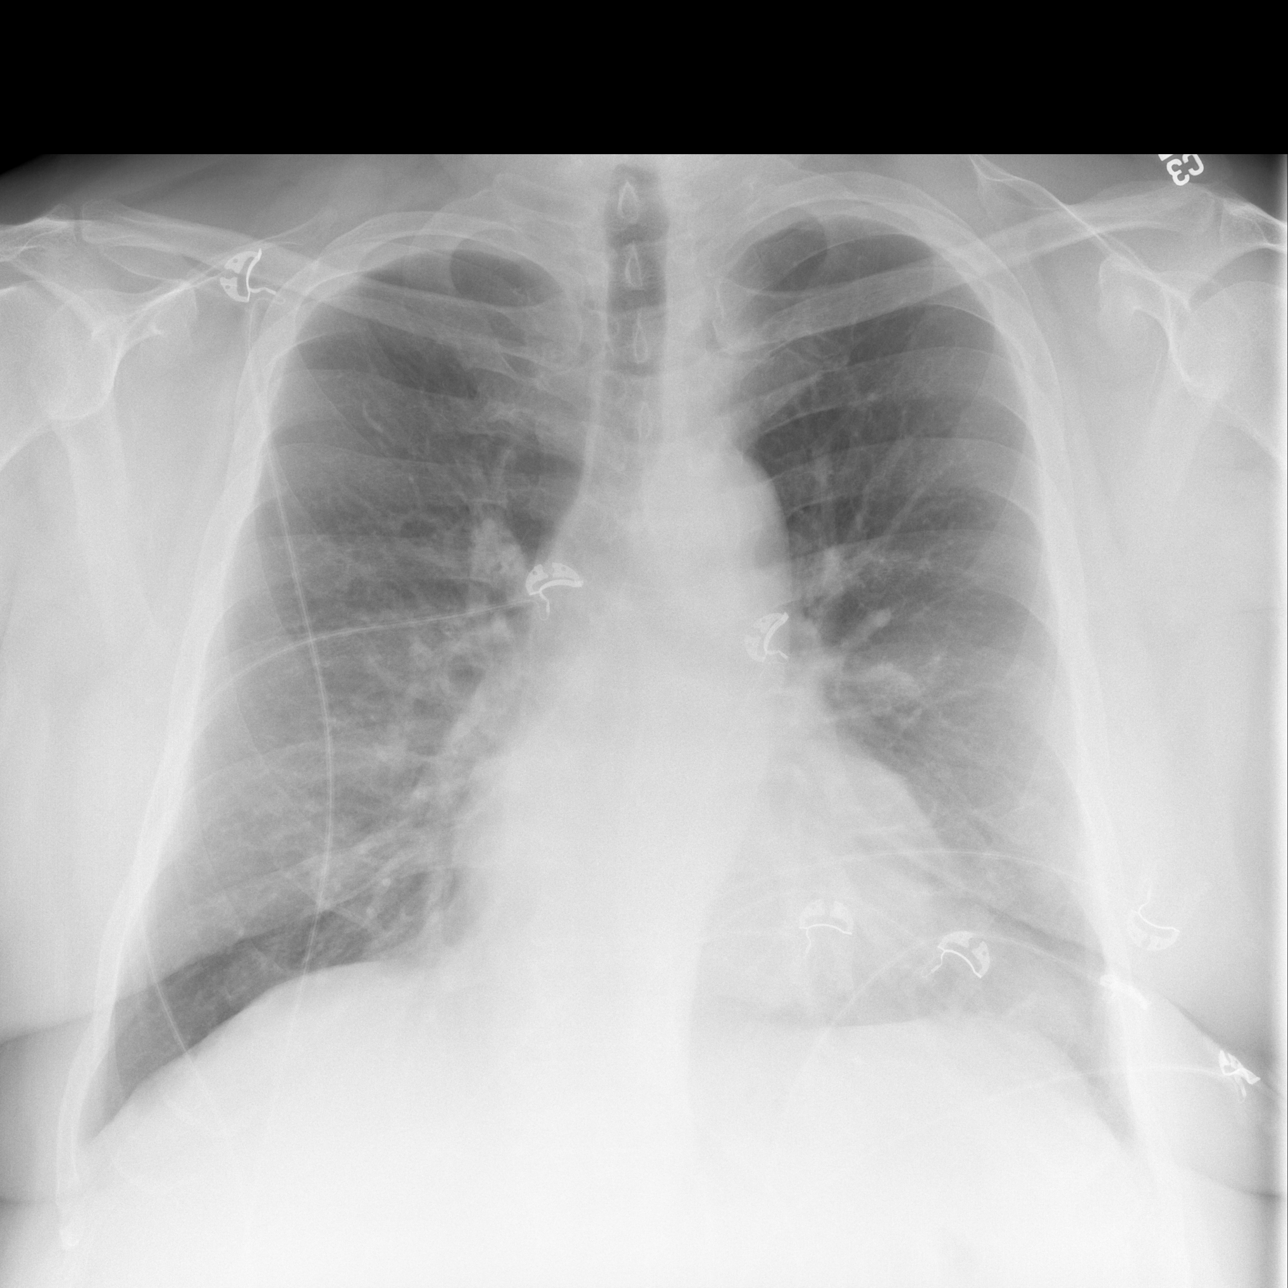

[w chest lat * (1 of 2)]
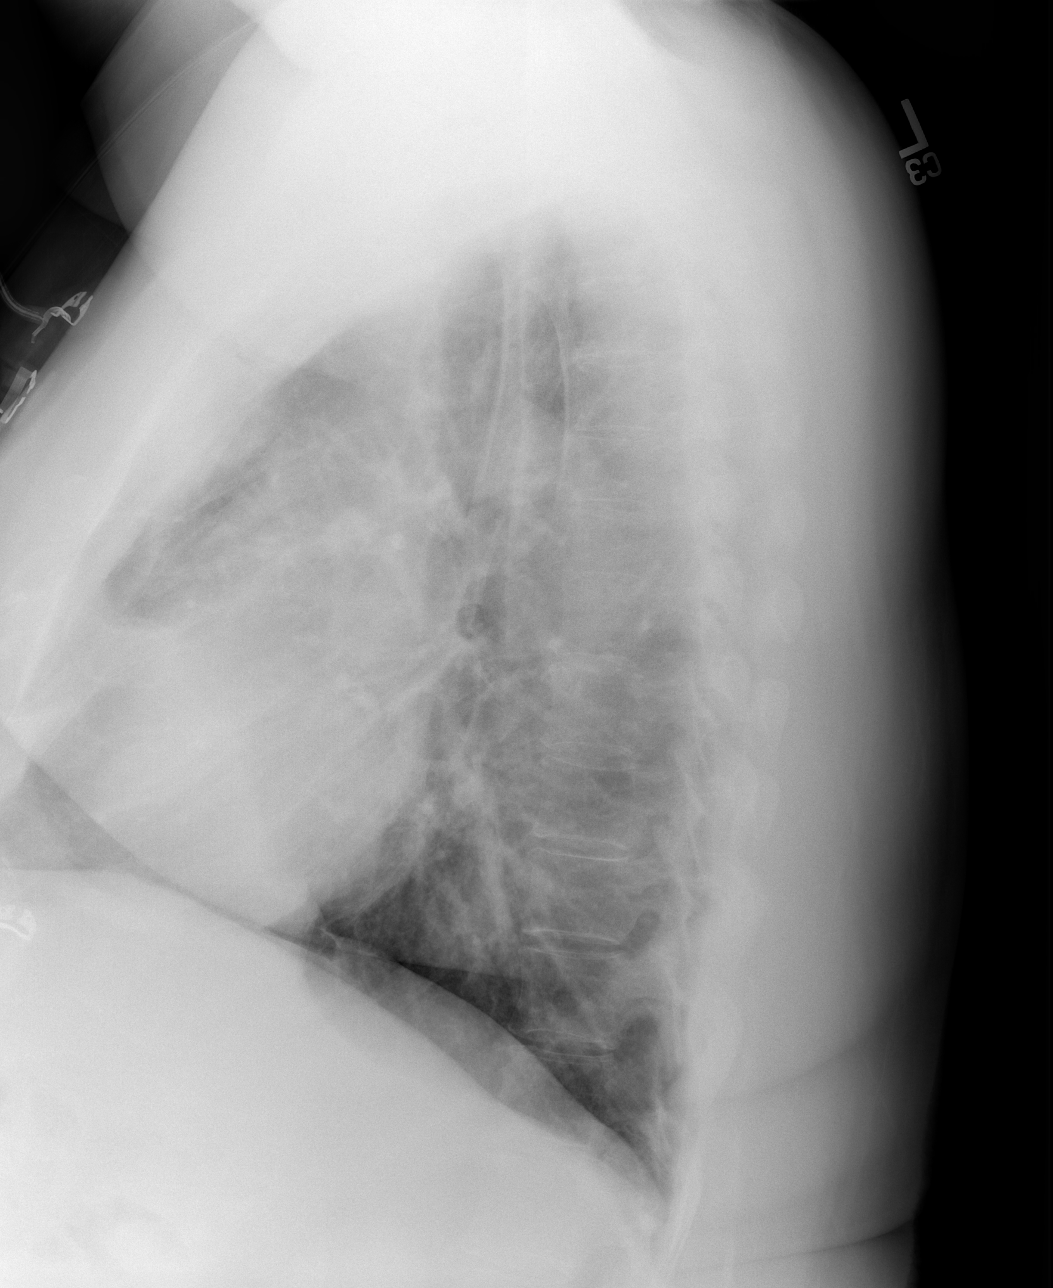

[w chest lat * (2 of 2)]
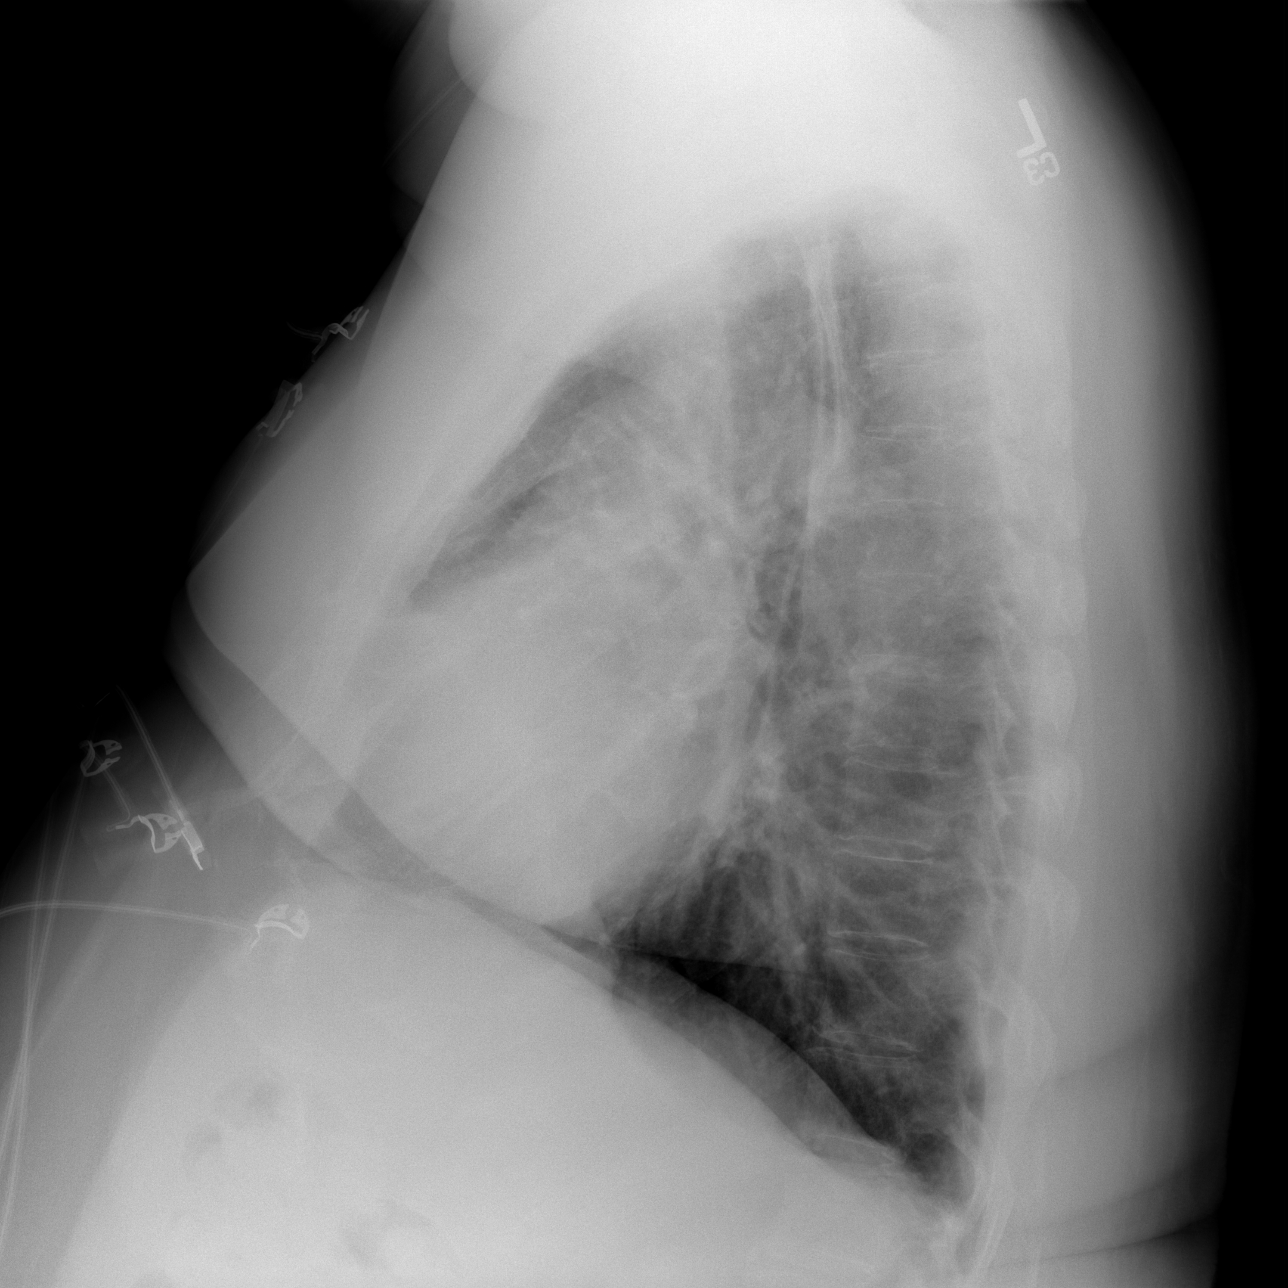

[3 of 3 positions shown; findings below may reference images not displayed]

FINDINGS: The lungs are clear without focal pneumonia, edema, pneumothorax or
pleural effusion. The cardiopericardial silhouette is within normal
limits for size. The visualized bony structures of the thorax are
intact. Telemetry leads overlie the chest.
IMPRESSION: No active cardiopulmonary disease.

## 2020-06-25 ENCOUNTER — Other Ambulatory Visit: Payer: Self-pay | Admitting: Cardiology

## 2020-08-23 ENCOUNTER — Encounter: Payer: Self-pay | Admitting: Cardiology

## 2020-08-23 ENCOUNTER — Telehealth (INDEPENDENT_AMBULATORY_CARE_PROVIDER_SITE_OTHER): Payer: BC Managed Care – PPO | Admitting: Cardiology

## 2020-08-23 ENCOUNTER — Other Ambulatory Visit: Payer: Self-pay

## 2020-08-23 VITALS — BP 122/79 | HR 61 | Temp 96.9°F | Ht 69.0 in | Wt 195.0 lb

## 2020-08-23 DIAGNOSIS — I10 Essential (primary) hypertension: Secondary | ICD-10-CM

## 2020-08-23 DIAGNOSIS — E785 Hyperlipidemia, unspecified: Secondary | ICD-10-CM

## 2020-08-23 DIAGNOSIS — I2510 Atherosclerotic heart disease of native coronary artery without angina pectoris: Secondary | ICD-10-CM

## 2020-08-23 MED ORDER — NITROGLYCERIN 0.4 MG SL SUBL: 0.4000 mg | SUBLINGUAL_TABLET | SUBLINGUAL | 3 refills | Status: AC | PRN

## 2020-08-23 MED ORDER — METOPROLOL TARTRATE 25 MG PO TABS: 25.0000 mg | ORAL_TABLET | Freq: Two times a day (BID) | ORAL | 1 refills | Status: DC

## 2020-08-23 MED ORDER — ATORVASTATIN CALCIUM 40 MG PO TABS: 40.0000 mg | ORAL_TABLET | Freq: Every day | ORAL | 1 refills | Status: DC

## 2020-08-23 MED ORDER — AMLODIPINE BESYLATE 10 MG PO TABS: 10.0000 mg | ORAL_TABLET | Freq: Every day | ORAL | 1 refills | Status: DC

## 2020-08-23 NOTE — Progress Notes (Signed)
Virtual Visit via Telephone Note   This visit type was conducted due to national recommendations for restrictions regarding the COVID-19 Pandemic (e.g. social distancing) in an effort to limit this patient's exposure and mitigate transmission in our community.  Due to his co-morbid illnesses, this patient is at least at moderate risk for complications without adequate follow up.  This format is felt to be most appropriate for this patient at this time.  The patient did not have access to video technology/had technical difficulties with video requiring transitioning to audio format only (telephone).  All issues noted in this document were discussed and addressed.  No physical exam could be performed with this format.  Please refer to the patient's chart for his  consent to telehealth for [facility].    Date:  08/23/2020   ID:  Matthew Walls, DOB Jul 04, 1956, MRN [id number] The patient was identified using 2 identifiers.  Patient Location: Home Provider Location: Home Office  PCP:  Maye Hides, PA  Cardiologist:  Donato Schultz, MD  Electrophysiologist:  None   Evaluation Performed:  Follow-Up Visit  Chief Complaint:  CAD  History of Present Illness:    Matthew Walls is a 64 y.o. male with history of CAD s/p MI 12/2014 treated with DES to circumflex.  Also with a history of hypertension, hyperlipidemia and psoriasis, seen for Dr. Anne Fu for yearly follow-up  Last seen by Dr. Anne Fu 03/01/2016 is doing well from a cardiac standpoint. On 07/24/2017 patient was seen with no complaints. 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 They continue to walk 30 to 45 minutes/day without angina or dyspnea.  He was compliant with his medications.  Last visit patient was doing very well that day from a cardiac perspective.    Today I see he was seen 07/2019 in ER with chest pain and neg Troponin, normal EKG  He had 4 cups of coffee that day  and a red bull.  And fudge.  It was decided it was caffeine.  He has not had problems since.  He is active and walks 8,000 steps per day.   Eats healthy. No chest pain and no SOB.  The patient does not have symptoms concerning for COVID-19 infection (fever, chills, cough, or new shortness of breath).    Past Medical History:  Diagnosis Date  . CAD (coronary artery disease)    cath 12/10/2014 occluded LCx treated with PTCA and DES (3.034 mm Resolute DES stent postdilated 3.23 mm)  . GERD (gastroesophageal reflux disease)   . Headache   . History of kidney stones   . Hypertension   . NSTEMI (non-ST elevated myocardial infarction) (HCC) 12/09/2014  . Psoriasis    Past Surgical History:  Procedure Laterality Date  . FINGER SURGERY Right   . LEFT HEART CATHETERIZATION WITH CORONARY ANGIOGRAM N/A 12/10/2014   Procedure: LEFT HEART CATHETERIZATION WITH CORONARY ANGIOGRAM;  Surgeon: Lennette Bihari, MD;  Location: [facility] CATH LAB;  Service: Cardiovascular;  Laterality: N/A;  . PERCUTANEOUS CORONARY STENT INTERVENTION (PCI-S)  12/10/2014   Procedure: PERCUTANEOUS CORONARY STENT INTERVENTION (PCI-S);  Surgeon: Lennette Bihari, MD;  Location: [facility] CATH LAB;  Service: Cardiovascular;;  Mid Circ Resolute 3x34  . RHINOPLASTY       Current Meds  Medication Sig  . amLODipine (NORVASC) 10 MG tablet Take 1 tablet (10 mg total) by mouth daily. Please make overdue appt with Dr. Anne Fu before anymore refills. 1st  attempt  . aspirin EC 81 MG EC tablet Take 1 tablet (81 mg total) by mouth daily.  Marland Kitchen atorvastatin (LIPITOR) 40 MG tablet Take 1 tablet (40 mg total) by mouth daily. Please make overdue appt with Dr. Anne Fu before anymore refills. 1st attempt  . ferrous sulfate 325 (65 FE) MG tablet Take 1 tablet by mouth daily.  . metoprolol tartrate (LOPRESSOR) 25 MG tablet Take 1 tablet (25 mg total) by mouth 2 (two) times daily. Please make overdue appt with Dr. Anne Fu before anymore refills. 1st attempt  .  nitroGLYCERIN (NITROSTAT) 0.4 MG SL tablet Place 1 tablet (0.4 mg total) under the tongue every 5 (five) minutes as needed for chest pain (x 3 doses daily).  . Vitamin D, Ergocalciferol, (DRISDOL) 50000 UNITS CAPS capsule Take 50,000 Units by mouth every 7 (seven) days.      Allergies:   Peanut-containing drug products   Social History   Tobacco Use  . Smoking status: Former Games developer  . Smokeless tobacco: Never Used  Substance Use Topics  . Alcohol use: No  . Drug use: No     Family Hx: The patient's family history includes CVA in his mother.  ROS:   Please see the history of present illness.    General:no colds or fevers, no weight changes Skin:no rashes or ulcers HEENT:no blurred vision, no congestion CV:see HPI PUL:see HPI GI:no diarrhea constipation or melena, no indigestion GU:no hematuria, no dysuria MS:no joint pain, no claudication Neuro:no syncope, no lightheadedness Endo:no diabetes, no thyroid disease  All other systems reviewed and are negative.   Prior CV studies:   The following studies were reviewed today:  12/2014 cardiac cath  IMPRESSION:  Non-ST segment elevation myocardial infarction secondary to total occlusion of the left circumflex vessel.  Three-vessel CAD with smooth 30% narrowing in the LAD, 30% ostial, 40-50% proximal followed by 70% AV groove circumflex stenosis prior to being totally occluded, and moderate RCA stenoses of 50-60% in the mid segment, 30% beyond the acute margin and 60% in the region of the PDA takeoff.  Successful percutaneous cardiac intervention of the left circumflex coronary artery with PTCA and DES stenting of the AV groove circumflex, and several sites in the obtuse 1 marginal vessel with insertion of a 3.034 mm Resolute DES stent postdilated 3.23 mm with the entire stented region reduced to 0%.  There is resumption of brisk TIMI-3 flow.   RECOMMENDATION:  Medical therapy for concomitant CAD.  Patient will continue  with dual antiplatelet therapy for minimum of a year and may benefit from long-term treatment beyond a year based on Pegasys trial data at areduce Brilinta dose.  Aggressive statin therapy will be implemented.   Labs/Other Tests and Data Reviewed:    EKG:  No EKG reviewed   Recent Labs: No results found for requested labs within last 8760 hours.   Recent Lipid Panel Lab Results  Component Value Date/Time   CHOL 84 03/05/2015 08:40 AM   TRIG 97.0 03/05/2015 08:40 AM   HDL 26.70 (L) 03/05/2015 08:40 AM   CHOLHDL 3 03/05/2015 08:40 AM   LDLCALC 38 03/05/2015 08:40 AM    Wt Readings from Last 3 Encounters:  08/23/20 195 lb (88.5 kg)  07/09/19 265 lb (120.2 kg)  06/25/19 210 lb (95.3 kg)     Risk Assessment/Calculations:      Objective:    Vital Signs:  BP 122/79   Pulse 61   Temp (!) 96.9 F (36.1 C)   Ht 5\' 9"  (  1.753 m)   Wt 195 lb (88.5 kg)   SpO2 98%   BMI 28.80 kg/m    VITAL SIGNS:  reviewed  Neuro: A&O X 3  Answers questions approp  General NAD Pulmonary can speak in complete sentences without SOB   ASSESSMENT & PLAN:    1. CAD wit hx of MI and stent. No angina exercises and eats healthy.  Will have him follow up in 1 year unless a problem then prn 2. HLD will recheck lipids, it has been some time.   Continue statin lipitor 40 mg  3. HTN controlled continue meds and refills sent in   Shared Decision Making/Informed Consent        COVID-19 Education: The signs and symptoms of COVID-19 were discussed with the patient and how to seek care for testing (follow up with PCP or arrange E-visit).  The importance of social distancing was discussed today.  Time:  Today, I have spent 5 minutes with the patient with telehealth technology discussing the above problems.     Medication Adjustments/Labs and Tests Ordered: Current medicines are reviewed at length with the patient today.  Concerns regarding medicines are outlined above.   Tests Ordered: No orders of  the defined types were placed in this encounter.   Medication Changes: No orders of the defined types were placed in this encounter.   Follow Up:  In Person in 1 year(s)  Signed, Nada Boozer, NP  08/23/2020 3:32 PM    [facility]

## 2020-08-23 NOTE — Patient Instructions (Addendum)
Medication Instructions:  Your physician recommends that you continue on your current medications as directed. Please refer to the Current Medication list given to you today.  *If you need a refill on your cardiac medications before your next appointment, please call your pharmacy*   Lab Work: RETURN FASTING IN 10 DAYS FOR CMP AND LIPIDS (Lab appointment 09/02/20 (lab hours 7:30 am - 5 pm MY CHART MESSAGE SENT )   If you have labs (blood work) drawn today and your tests are completely normal, you will receive your results only by: Marland Kitchen MyChart Message (if you have MyChart) OR . A paper copy in the mail If you have any lab test that is abnormal or we need to change your treatment, we will call you to review the results.   Testing/Procedures:NONE ORDERED  TODAY   Follow-Up: At [facility], you and your health needs are our priority.  As part of our continuing mission to provide you with exceptional heart care, we have created designated Provider Care Teams.  These Care Teams include your primary Cardiologist (physician) and Advanced Practice Providers (APPs -  Physician Assistants and Nurse Practitioners) who all work together to provide you with the care you need, when you need it.  We recommend signing up for the patient portal called "MyChart".  Sign up information is provided on this After Visit Summary.  MyChart is used to connect with patients for Virtual Visits (Telemedicine).  Patients are able to view lab/test results, encounter notes, upcoming appointments, etc.  Non-urgent messages can be sent to your provider as well.   To learn more about what you can do with MyChart, go to [email or web address].    Your next appointment:   1 year(s)  The format for your next appointment:   In Person  Provider:   Donato Schultz, MD   Other Instructions

## 2020-09-02 ENCOUNTER — Other Ambulatory Visit: Payer: BC Managed Care – PPO

## 2021-02-16 ENCOUNTER — Other Ambulatory Visit: Payer: Self-pay | Admitting: Cardiology

## 2022-02-11 ENCOUNTER — Other Ambulatory Visit: Payer: Self-pay | Admitting: Cardiology

## 2022-04-17 ENCOUNTER — Telehealth: Payer: Self-pay | Admitting: Cardiology

## 2022-04-17 ENCOUNTER — Other Ambulatory Visit: Payer: Self-pay | Admitting: Cardiology

## 2022-04-17 MED ORDER — AMLODIPINE BESYLATE 10 MG PO TABS: 10.0000 mg | ORAL_TABLET | Freq: Every day | ORAL | 0 refills | Status: DC

## 2022-04-17 NOTE — Telephone Encounter (Signed)
*  STAT* If patient is at the pharmacy, call can be transferred to refill team.   1. Which medications need to be refilled? (please list name of each medication and dose if known) (amLODipine (NORVASC) 10 MG tablet and metoprolol tartrate (LOPRESSOR) 25 MG tablet)   2. Which pharmacy/location (including street and city if local pharmacy) is medication to be sent to? [id number] - [place], [place] - [address]  3. Do they need a 30 day or 90 day supply? 7 day  Pt is completely out of medication and needs a 7 day supply. Pt mad an appt with APP [facility] 04/27/22 @ 9:00am.

## 2022-04-17 NOTE — Telephone Encounter (Signed)
Pt is requesting a telemedicine f/u visit. He states he was told to schedule one in order to get his medication refilled (amLODipine (NORVASC) 10 MG tablet and metoprolol tartrate (LOPRESSOR) 25 MG tablet)

## 2022-04-17 NOTE — Telephone Encounter (Signed)
30 day supply of amlodipine sent to patients preferred pharmacy. Must keep upcoming appointment on 7/27 for further refills

## 2022-04-18 ENCOUNTER — Other Ambulatory Visit: Payer: Self-pay | Admitting: Cardiology

## 2022-04-18 ENCOUNTER — Telehealth: Payer: Self-pay | Admitting: Cardiology

## 2022-04-18 MED ORDER — METOPROLOL TARTRATE 25 MG PO TABS: 25.0000 mg | ORAL_TABLET | Freq: Two times a day (BID) | ORAL | 0 refills | Status: DC

## 2022-04-18 NOTE — Telephone Encounter (Signed)
*  STAT* If patient is at the pharmacy, call can be transferred to refill team.   1. Which medications need to be refilled? (please list name of each medication and dose if known) metoprolol tartrate (LOPRESSOR) 25 MG tablet  2. Which pharmacy/location (including street and city if local pharmacy) is medication to be sent to? [id number] - [place], [place] - [address]  3. Do they need a 30 day or 90 day supply? 30

## 2022-04-18 NOTE — Telephone Encounter (Signed)
Refill complete 

## 2022-04-27 ENCOUNTER — Ambulatory Visit: Payer: BC Managed Care – PPO | Admitting: Physician Assistant

## 2022-05-01 ENCOUNTER — Other Ambulatory Visit: Payer: Self-pay | Admitting: Cardiology

## 2022-05-01 NOTE — Telephone Encounter (Signed)
Pt's medication was sent to pt's pharmacy as requested. Confirmation received.  °

## 2022-05-11 ENCOUNTER — Other Ambulatory Visit: Payer: Self-pay | Admitting: Cardiology

## 2022-05-11 NOTE — Telephone Encounter (Signed)
Pt has not been seen since 2021, has cancelled numerous appts. Pt has an appt now in October 2023. Would Dr. Anne Fu like to refill pt's medications until then? Please address

## 2022-05-11 NOTE — Telephone Encounter (Signed)
*  STAT* If patient is at the pharmacy, call can be transferred to refill team.   1. Which medications need to be refilled? (please list name of each medication and dose if known)   atorvastatin (LIPITOR) 40 MG tablet  amLODipine (NORVASC) 10 MG tablet  metoprolol tartrate (LOPRESSOR) 25 MG tablet   2. Which pharmacy/location (including street and city if local pharmacy) is medication to be sent to?  [id number] - [place], [place] - [address]  3. Do they need a 30 day or 90 day supply?   90 day  Patient stated he still has about a week left of these medications.    Patient rescheduled appointment to 07/10/22.

## 2022-05-12 ENCOUNTER — Other Ambulatory Visit: Payer: Self-pay | Admitting: Cardiology

## 2022-05-12 ENCOUNTER — Telehealth: Payer: Self-pay | Admitting: Cardiology

## 2022-05-12 NOTE — Telephone Encounter (Signed)
*  STAT* If patient is at the pharmacy, call can be transferred to refill team.   1. Which medications need to be refilled? (please list name of each medication and dose if known)  amLODipine (NORVASC) 10 MG tablet metoprolol tartrate (LOPRESSOR) 25 MG tablet  2. Which pharmacy/location (including street and city if local pharmacy) is medication to be sent to? [id number] - [place], [place] - [address]  3. Do they need a 30 day or 90 day supply?  Patient will run out of medication during the weekend. He is requesting enough medication to last him until 10/09 appointment.

## 2022-05-15 ENCOUNTER — Ambulatory Visit: Payer: BC Managed Care – PPO | Admitting: Physician Assistant

## 2022-05-15 MED ORDER — METOPROLOL TARTRATE 25 MG PO TABS: ORAL_TABLET | ORAL | 1 refills | Status: DC

## 2022-05-15 MED ORDER — AMLODIPINE BESYLATE 10 MG PO TABS: 10.0000 mg | ORAL_TABLET | Freq: Every day | ORAL | 1 refills | Status: DC

## 2022-05-15 NOTE — Telephone Encounter (Signed)
Pt's medication was sent to pt's pharmacy as requested. Confirmation received.  °

## 2022-06-15 ENCOUNTER — Other Ambulatory Visit: Payer: Self-pay | Admitting: Cardiology

## 2022-06-16 ENCOUNTER — Other Ambulatory Visit: Payer: Self-pay

## 2022-06-16 MED ORDER — METOPROLOL TARTRATE 25 MG PO TABS: ORAL_TABLET | ORAL | 0 refills | Status: DC

## 2022-06-16 NOTE — Telephone Encounter (Signed)
Pt's medication was sent to pt's pharmacy as requested. Confirmation received.  °

## 2022-07-10 ENCOUNTER — Ambulatory Visit: Payer: BC Managed Care – PPO | Admitting: Physician Assistant

## 2022-07-10 NOTE — Progress Notes (Signed)
Office Visit    Patient Name: Matthew Walls Date of Encounter: 07/13/2022  Primary Care Provider:  Maye Hides, PA Primary Cardiologist:  Donato Schultz, MD Primary Electrophysiologist: None  Chief Complaint    [name] is a 66 y.o. male with PMH of CAD s/p MI on 12/2014 treated with DES x1 to circumflex, psoriasis, HTN, HLD who presents today for follow-up of coronary artery disease.  Past Medical History    Past Medical History:  Diagnosis Date   CAD (coronary artery disease)    cath 12/10/2014 occluded LCx treated with PTCA and DES (3.034 mm Resolute DES stent postdilated 3.23 mm)   GERD (gastroesophageal reflux disease)    Headache    History of kidney stones    Hypertension    NSTEMI (non-ST elevated myocardial infarction) (HCC) 12/09/2014   Psoriasis    Past Surgical History:  Procedure Laterality Date   FINGER SURGERY Right    LEFT HEART CATHETERIZATION WITH CORONARY ANGIOGRAM N/A 12/10/2014   Procedure: LEFT HEART CATHETERIZATION WITH CORONARY ANGIOGRAM;  Surgeon: Lennette Bihari, MD;  Location: [facility] CATH LAB;  Service: Cardiovascular;  Laterality: N/A;   PERCUTANEOUS CORONARY STENT INTERVENTION (PCI-S)  12/10/2014   Procedure: PERCUTANEOUS CORONARY STENT INTERVENTION (PCI-S);  Surgeon: Lennette Bihari, MD;  Location: [facility] CATH LAB;  Service: Cardiovascular;;  Mid Circ Resolute 3x34   RHINOPLASTY      Allergies  Allergies  Allergen Reactions   Peanut-Containing Drug Products Swelling    History of Present Illness    Matthew Walls  is a 66 year old male with the above mention past medical history who presents today for follow-up of coronary artery disease.  Matthew Walls has been followed by Dr. Anne Fu since 2016 when he presented to ED with complaint of chest pain and found to have NSTEMI.  He was treated with DES x1 to left circumflex.  He has been seen yearly for follow-ups and has done well from cardiac standpoint.  He was last seen by Nada Boozer, NP on 08/2020  for follow-up.  During his visit patient had no complaints and was tolerating current medications without adverse reactions.  Patient's blood pressure was well controlled and cholesterol was rechecked during visit.  Mr. Hernon presents today for overdue follow-up alone.  Since last being seen in the office patient reports that he has been doing well from a cardiac perspective however he has gained significant amount of weight from working from home during the pandemic.  He is currently back working from the office and he states that he is getting over 10,000 steps per day.  He reports also that he has lost 5 pounds since returning back to the office.  Initially his blood pressures were elevated at 142/78 and were 138/78 on recheck.  He is currently not participating in any exercise programs but is planning to resume activity with his treadmill at home.  We discussed the importance of abstaining from excess salt and limiting takeout foods due to excess sodium.  He is compliant with his current medications and reports no adverse reactions or side effects.  Patient denies chest pain, palpitations, dyspnea, PND, orthopnea, nausea, vomiting, dizziness, syncope, edema, weight gain, or early satiety.  Home Medications    Current Outpatient Medications  Medication Sig Dispense Refill   amLODipine (NORVASC) 10 MG tablet Take 1 tablet (10 mg total) by mouth daily. 30 tablet 1   aspirin EC 81 MG EC tablet Take 1 tablet (81 mg total) by mouth  daily.     atorvastatin (LIPITOR) 40 MG tablet TAKE 1 TABLET(40 MG) BY MOUTH DAILY. APPOINTMENT NEEDED FOR FUTURE REFILLS 30 tablet 0   ferrous sulfate 325 (65 FE) MG tablet Take 1 tablet by mouth daily.  3   nitroGLYCERIN (NITROSTAT) 0.4 MG SL tablet Place 1 tablet (0.4 mg total) under the tongue every 5 (five) minutes as needed for chest pain (x 3 doses daily). 25 tablet 3   Vitamin D, Ergocalciferol, (DRISDOL) 50000 UNITS CAPS capsule Take 50,000 Units by mouth every 7  (seven) days.   11   No current facility-administered medications for this visit.     Review of Systems  Please see the history of present illness.    (+) Trace lower extremity edema  All other systems reviewed and are otherwise negative except as noted above.  Physical Exam    Wt Readings from Last 3 Encounters:  07/13/22 281 lb (127.5 kg)  08/23/20 195 lb (88.5 kg)  07/09/19 265 lb (120.2 kg)   VS: Vitals:   07/13/22 0825 07/13/22 0848  BP: (!) 142/78 138/78  Pulse: 72   SpO2: 95%   ,Body mass index is 40.9 kg/m.  Constitutional:      Appearance: Obese not in distress.  Neck:     Vascular: JVD normal.  Pulmonary:     Effort: Pulmonary effort is normal.     Breath sounds: No wheezing. No rales. Diminished in the bases Cardiovascular:     Normal rate. Regular rhythm. Normal S1. Normal S2.      Murmurs: There is no murmur.  Edema:    Peripheral edema absent.  Abdominal:     Palpations: Abdomen is soft non tender. There is no hepatomegaly.  Skin:    General: Skin is warm and dry.  Neurological:     General: No focal deficit present.     Mental Status: Alert and oriented to person, place and time.     Cranial Nerves: Cranial nerves are intact.  EKG/LABS/Other Studies Reviewed    ECG personally reviewed by me today -sinus rhythm with possible left atrial enlargement with rate of 72 bpm with no acute changes noted compared to previous EKG.  Risk Assessment/Calculations:    Lab Results  Component Value Date   WBC 13.8 (H) 07/09/2019   HGB 15.6 07/09/2019   HCT 47.5 07/09/2019   MCV 89.0 07/09/2019   PLT 246 07/09/2019   Lab Results  Component Value Date   CREATININE 0.98 07/09/2019   BUN 27 (H) 07/09/2019   NA 139 07/09/2019   K 3.5 07/09/2019   CL 100 07/09/2019   CO2 27 07/09/2019   Lab Results  Component Value Date   ALT 62 (H) 07/09/2019   AST 67 (H) 07/09/2019   ALKPHOS 78 07/09/2019   BILITOT 0.6 07/09/2019   Lab Results  Component Value  Date   CHOL 84 03/05/2015   HDL 26.70 (L) 03/05/2015   LDLCALC 38 03/05/2015   TRIG 97.0 03/05/2015   CHOLHDL 3 03/05/2015    Lab Results  Component Value Date   HGBA1C 5.7 (H) 12/10/2014    Assessment & Plan    1.  Coronary artery disease: -s/p NSTEMI on 12/2014 treated with DES x1 to circumflex -Today patient reports no concerns with chest pain or anginal equivalent -He has gained significant amount of weight since his previous visit that he attributes to sedentary lifestyle. -Continue current GDMT with ASA 81 mg, Lipitor 40 mg, daily and nitroglycerin as needed -  We will discontinue metoprolol and add carvedilol 3.125 mg twice daily for better BP management.  2.  Essential hypertension: -Patient's blood pressure today was elevated at 142/78 and was 138/78 on recheck -He endorses readings in the 130s to 140s at home over 70s. -We discussed the importance of abstaining from excess salt and reducing consumption of takeout foods. -Continue Norvasc 10 mg and carvedilol as noted above  3.  Hyperlipidemia: -Patient not fasting today and we will have him come back for LFTs and lipids -Continue atorvastatin 40 mg for now pending lipid recheck.  4.  Obesity: -Patient's current BMI is 40.90 kg/m  -He is currently sedentary but has resumed working from home. -We discussed the importance of increasing physical activity and he is planning to start a exercise program soon. -[employer] plant-based diet encouraged   Disposition: Follow-up with Donato Schultz, MD or APP in 4 months   Medication Adjustments/Labs and Tests Ordered: Current medicines are reviewed at length with the patient today.  Concerns regarding medicines are outlined above.   Signed, Napoleon Form, Leodis Rains, NP 07/13/2022, 8:50 AM [facility]  Note:  This document was prepared using Dragon voice recognition software and may include unintentional dictation errors.

## 2022-07-13 ENCOUNTER — Encounter: Payer: Self-pay | Admitting: Nurse Practitioner

## 2022-07-13 ENCOUNTER — Ambulatory Visit: Payer: BC Managed Care – PPO | Attending: Physician Assistant | Admitting: Nurse Practitioner

## 2022-07-13 ENCOUNTER — Other Ambulatory Visit: Payer: Self-pay | Admitting: Nurse Practitioner

## 2022-07-13 VITALS — BP 138/78 | HR 72 | Ht 69.5 in | Wt 281.0 lb

## 2022-07-13 DIAGNOSIS — I10 Essential (primary) hypertension: Secondary | ICD-10-CM

## 2022-07-13 DIAGNOSIS — I2510 Atherosclerotic heart disease of native coronary artery without angina pectoris: Secondary | ICD-10-CM | POA: Diagnosis not present

## 2022-07-13 DIAGNOSIS — E785 Hyperlipidemia, unspecified: Secondary | ICD-10-CM

## 2022-07-13 DIAGNOSIS — I2583 Coronary atherosclerosis due to lipid rich plaque: Secondary | ICD-10-CM | POA: Diagnosis not present

## 2022-07-13 LAB — BASIC METABOLIC PANEL
BUN/Creatinine Ratio: 21 (ref 10–24)
BUN: 25 mg/dL (ref 8–27)
CO2: 31 mmol/L — ABNORMAL HIGH (ref 20–29)
Calcium: 10.4 mg/dL — ABNORMAL HIGH (ref 8.6–10.2)
Chloride: 102 mmol/L (ref 96–106)
Creatinine, Ser: 1.17 mg/dL (ref 0.76–1.27)
Glucose: 100 mg/dL — ABNORMAL HIGH (ref 70–99)
Potassium: 5 mmol/L (ref 3.5–5.2)
Sodium: 141 mmol/L (ref 134–144)
eGFR: 70 mL/min/{1.73_m2} (ref >59)

## 2022-07-13 LAB — CBC
Hematocrit: 43.6 % (ref 37.5–51.0)
Hemoglobin: 14.6 g/dL (ref 13.0–17.7)
MCH: 29.6 pg (ref 26.6–33.0)
MCHC: 33.5 g/dL (ref 31.5–35.7)
MCV: 88 fL (ref 79–97)
Platelets: 246 10*3/uL (ref 150–450)
RBC: 4.93 x10E6/uL (ref 4.14–5.80)
RDW: 14.7 % (ref 11.6–15.4)
WBC: 10.4 10*3/uL (ref 3.4–10.8)

## 2022-07-13 MED ORDER — ATORVASTATIN CALCIUM 40 MG PO TABS: ORAL_TABLET | ORAL | 0 refills | Status: DC

## 2022-07-13 MED ORDER — AMLODIPINE BESYLATE 10 MG PO TABS: 10.0000 mg | ORAL_TABLET | Freq: Every day | ORAL | 3 refills | Status: DC

## 2022-07-13 MED ORDER — CARVEDILOL 3.125 MG PO TABS: 3.1250 mg | ORAL_TABLET | Freq: Two times a day (BID) | ORAL | 3 refills | Status: DC

## 2022-07-13 NOTE — Patient Instructions (Signed)
Medication Instructions:  Stop Metoprolol  Start Carvedilol (Coreg) 3.125 mg twice a day   *If you need a refill on your cardiac medications before your next appointment, please call your pharmacy*   Lab Work: Bmp, Cbc- today   Your physician recommends that you return for a FASTING lipid profile and hepatic function test on Thursday, October 19. Lab is open from 7:15 am to 4:30 pm.   If you have labs (blood work) drawn today and your tests are completely normal, you will receive your results only by: [place] (if you have MyChart) OR A paper copy in the mail If you have any lab test that is abnormal or we need to change your treatment, we will call you to review the results.   Testing/Procedures: None ordered    Follow-Up: Follow up as scheduled   Other Instructions   Important Information About Sugar

## 2022-07-20 ENCOUNTER — Other Ambulatory Visit: Payer: BC Managed Care – PPO

## 2022-07-27 ENCOUNTER — Other Ambulatory Visit: Payer: BC Managed Care – PPO

## 2022-10-20 ENCOUNTER — Ambulatory Visit: Payer: BC Managed Care – PPO | Admitting: Nurse Practitioner

## 2022-11-15 ENCOUNTER — Other Ambulatory Visit: Payer: Self-pay | Admitting: Nurse Practitioner

## 2022-11-21 ENCOUNTER — Ambulatory Visit: Payer: BC Managed Care – PPO | Admitting: Nurse Practitioner

## 2023-01-08 NOTE — Progress Notes (Deleted)
Office Visit    Patient Name: Matthew Walls Date of Encounter: 01/08/2023  Primary Care Provider:  Maye Hides, PA Primary Cardiologist:  Donato Schultz, MD Primary Electrophysiologist: None  Chief Complaint    Matthew Walls is a 67 y.o. male with PMH of CAD s/p MI on 12/2014 treated with DES x1 to circumflex, psoriasis, HTN, HLD who presents today for 79-month follow-up of coronary artery disease.  Past Medical History    Past Medical History:  Diagnosis Date   CAD (coronary artery disease)    cath 12/10/2014 occluded LCx treated with PTCA and DES (3.034 mm Resolute DES stent postdilated 3.23 mm)   GERD (gastroesophageal reflux disease)    Headache    History of kidney stones    Hypertension    NSTEMI (non-ST elevated myocardial infarction) (HCC) 12/09/2014   Psoriasis    Past Surgical History:  Procedure Laterality Date   FINGER SURGERY Right    LEFT HEART CATHETERIZATION WITH CORONARY ANGIOGRAM N/A 12/10/2014   Procedure: LEFT HEART CATHETERIZATION WITH CORONARY ANGIOGRAM;  Surgeon: Lennette Bihari, MD;  Location: [facility] CATH LAB;  Service: Cardiovascular;  Laterality: N/A;   PERCUTANEOUS CORONARY STENT INTERVENTION (PCI-S)  12/10/2014   Procedure: PERCUTANEOUS CORONARY STENT INTERVENTION (PCI-S);  Surgeon: Lennette Bihari, MD;  Location: [facility] CATH LAB;  Service: Cardiovascular;;  Mid Circ Resolute 3x34   RHINOPLASTY      Allergies  Allergies  Allergen Reactions   Peanut-Containing Drug Products Swelling    History of Present Illness    Matthew Walls  is a 67 year old male with the above mention past medical history who presents today for follow-up of coronary artery disease.  [name] has been followed by Dr. Anne Fu since 2016 when he presented to ED with complaint of chest pain and found to have NSTEMI.  He was treated with DES x1 to left circumflex.  He has been seen yearly for follow-ups and has done well from cardiac standpoint.  He was last seen by Nada Boozer, NP on  08/2020 for follow-up.  During his visit patient had no complaints and was tolerating current medications without adverse reactions.  Patient's blood pressure was well controlled and cholesterol was rechecked during visit.  During his follow-up visit 07/2022 he reported doing well with no new cardiac complaints.  He did note gaining his significant mount of weight due to the pandemic and working from home.  He was also noted to have elevated blood pressure and metoprolol was discontinued and carvedilol was started.  Since last being seen in the office patient reports***.  Patient denies chest pain, palpitations, dyspnea, PND, orthopnea, nausea, vomiting, dizziness, syncope, edema, weight gain, or early satiety.     ***Notes: -Blood pressure since discontinuing metoprolol for carvedilol Home Medications    Current Outpatient Medications  Medication Sig Dispense Refill   amLODipine (NORVASC) 10 MG tablet Take 1 tablet (10 mg total) by mouth daily. 90 tablet 3   aspirin EC 81 MG EC tablet Take 1 tablet (81 mg total) by mouth daily.     atorvastatin (LIPITOR) 40 MG tablet TAKE 1 TABLET(40 MG) BY MOUTH DAILY. Please keep upcoming appointment for future refills. Thank you. 90 tablet 0   carvedilol (COREG) 3.125 MG tablet Take 1 tablet (3.125 mg total) by mouth 2 (two) times daily. 180 tablet 3   ferrous sulfate 325 (65 FE) MG tablet Take 1 tablet by mouth daily.  3   nitroGLYCERIN (NITROSTAT) 0.4 MG SL tablet Place  1 tablet (0.4 mg total) under the tongue every 5 (five) minutes as needed for chest pain (x 3 doses daily). 25 tablet 3   Vitamin D, Ergocalciferol, (DRISDOL) 50000 UNITS CAPS capsule Take 50,000 Units by mouth every 7 (seven) days.   11   No current facility-administered medications for this visit.     Review of Systems  Please see the history of present illness.    (+)*** (+)***  All other systems reviewed and are otherwise negative except as noted above.  Physical Exam    Wt  Readings from Last 3 Encounters:  07/13/22 281 lb (127.5 kg)  08/23/20 195 lb (88.5 kg)  07/09/19 265 lb (120.2 kg)   BJ:YNWGNVS:There were no vitals filed for this visit.,There is no height or weight on file to calculate BMI.  Constitutional:      Appearance: Healthy appearance. Not in distress.  Neck:     Vascular: JVD normal.  Pulmonary:     Effort: Pulmonary effort is normal.     Breath sounds: No wheezing. No rales. Diminished in the bases Cardiovascular:     Normal rate. Regular rhythm. Normal S1. Normal S2.      Murmurs: There is no murmur.  Edema:    Peripheral edema absent.  Abdominal:     Palpations: Abdomen is soft non tender. There is no hepatomegaly.  Skin:    General: Skin is warm and dry.  Neurological:     General: No focal deficit present.     Mental Status: Alert and oriented to person, place and time.     Cranial Nerves: Cranial nerves are intact.  EKG/LABS/ Recent Cardiac Studies    ECG personally reviewed by me today - ***  Risk Assessment/Calculations:   {Does this patient have ATRIAL FIBRILLATION?:[phone]}        Lab Results  Component Value Date   WBC 10.4 07/13/2022   HGB 14.6 07/13/2022   HCT 43.6 07/13/2022   MCV 88 07/13/2022   PLT 246 07/13/2022   Lab Results  Component Value Date   CREATININE 1.17 07/13/2022   BUN 25 07/13/2022   NA 141 07/13/2022   K 5.0 07/13/2022   CL 102 07/13/2022   CO2 31 (H) 07/13/2022   Lab Results  Component Value Date   ALT 62 (H) 07/09/2019   AST 67 (H) 07/09/2019   ALKPHOS 78 07/09/2019   BILITOT 0.6 07/09/2019   Lab Results  Component Value Date   CHOL 84 03/05/2015   HDL 26.70 (L) 03/05/2015   LDLCALC 38 03/05/2015   TRIG 97.0 03/05/2015   CHOLHDL 3 03/05/2015    Lab Results  Component Value Date   HGBA1C 5.7 (H) 12/10/2014        Assessment & Plan    1.  Coronary artery disease: -s/p NSTEMI on 12/2014 treated with DES x1 to circumflex -Today patient reports no concerns with chest  pain or anginal equivalent -He has gained significant amount of weight since his previous visit that he attributes to sedentary lifestyle. -Continue current GDMT with ASA 81 mg, Lipitor 40 mg, daily and nitroglycerin as needed -We will discontinue metoprolol and add carvedilol 3.125 mg twice daily for better BP management.   2.  Essential hypertension: -Patient's blood pressure today was elevated at 142/78 and was 138/78 on recheck -He endorses readings in the 130s to 140s at home over 70s. -We discussed the importance of abstaining from excess salt and reducing consumption of takeout foods. -Continue Norvasc 10 mg and  carvedilol as noted above   3.  Hyperlipidemia: -Patient not fasting today and we will have him come back for LFTs and lipids -Continue atorvastatin 40 mg for now pending lipid recheck.   4.  Obesity: -Patient's current BMI is 40.90 kg/m  -He is currently sedentary but has resumed working from home. -We discussed the importance of increasing physical activity and he is planning to start a exercise program soon. -[employer] plant-based diet encouraged      Disposition: Follow-up with Donato Schultz, MD or APP in *** months {Are you ordering a CV Procedure (e.g. stress test, cath, DCCV, TEE, etc)?   Press F2        :[id number]}   Medication Adjustments/Labs and Tests Ordered: Current medicines are reviewed at length with the patient today.  Concerns regarding medicines are outlined above.   Signed, Napoleon Form, Leodis Rains, NP 01/08/2023, 8:42 AM [facility]  Note:  This document was prepared using Dragon voice recognition software and may include unintentional dictation errors.

## 2023-01-09 ENCOUNTER — Ambulatory Visit: Payer: Self-pay | Admitting: Nurse Practitioner

## 2023-02-21 ENCOUNTER — Other Ambulatory Visit: Payer: Self-pay | Admitting: Nurse Practitioner

## 2023-07-20 ENCOUNTER — Other Ambulatory Visit: Payer: Self-pay | Admitting: Nurse Practitioner

## 2023-07-23 ENCOUNTER — Other Ambulatory Visit: Payer: Self-pay | Admitting: Nurse Practitioner

## 2023-09-03 ENCOUNTER — Other Ambulatory Visit: Payer: Self-pay | Admitting: Nurse Practitioner

## 2023-09-03 ENCOUNTER — Other Ambulatory Visit: Payer: Self-pay | Admitting: Cardiology

## 2023-09-06 MED ORDER — AMLODIPINE BESYLATE 10 MG PO TABS: 10.0000 mg | ORAL_TABLET | Freq: Every day | ORAL | 0 refills | Status: DC

## 2023-09-07 ENCOUNTER — Other Ambulatory Visit: Payer: Self-pay

## 2023-09-07 MED ORDER — ATORVASTATIN CALCIUM 40 MG PO TABS: 40.0000 mg | ORAL_TABLET | Freq: Every day | ORAL | 0 refills | Status: DC

## 2023-09-28 ENCOUNTER — Other Ambulatory Visit: Payer: Self-pay | Admitting: Cardiology

## 2023-09-28 ENCOUNTER — Other Ambulatory Visit: Payer: Self-pay | Admitting: Nurse Practitioner

## 2023-10-29 ENCOUNTER — Telehealth: Payer: Self-pay | Admitting: Cardiology

## 2023-10-29 MED ORDER — AMLODIPINE BESYLATE 10 MG PO TABS: 10.0000 mg | ORAL_TABLET | Freq: Every day | ORAL | 3 refills | Status: DC

## 2023-10-29 MED ORDER — CARVEDILOL 3.125 MG PO TABS: 3.1250 mg | ORAL_TABLET | Freq: Two times a day (BID) | ORAL | 3 refills | Status: DC

## 2023-10-29 MED ORDER — ATORVASTATIN CALCIUM 40 MG PO TABS: 40.0000 mg | ORAL_TABLET | Freq: Every day | ORAL | 3 refills | Status: DC

## 2023-10-29 NOTE — Telephone Encounter (Signed)
Pt's medications were sent to pt's pharmacy as requested. Confirmation received.

## 2023-10-29 NOTE — Telephone Encounter (Signed)
*  STAT* If patient is at the pharmacy, call can be transferred to refill team.   1. Which medications need to be refilled? (please list name of each medication and dose if known) amLODipine (NORVASC) 10 MG tablet atorvastatin (LIPITOR) 40 MG tablet carvedilol (COREG) 3.125 MG tablet   2. Would you like to learn more about the convenience, safety, & potential cost savings by using the [facility]?No   3. Are you open to using the [facility] No   4. Which pharmacy/location (including street and city if local pharmacy) is medication to be sent to?[id number] - [place], [place] - [address]   5. Do they need a 30 day or 90 day supply? 90 Day Supply  Pt is scheduled to see Dr. Anne Fu on 02/07/24

## 2024-02-07 ENCOUNTER — Ambulatory Visit: Payer: Self-pay | Attending: Cardiology | Admitting: Cardiology

## 2024-02-07 ENCOUNTER — Encounter: Payer: Self-pay | Admitting: Cardiology

## 2024-02-07 ENCOUNTER — Other Ambulatory Visit: Payer: Self-pay | Admitting: *Deleted

## 2024-02-07 VITALS — BP 130/78 | HR 85 | Ht 69.5 in

## 2024-02-07 DIAGNOSIS — I10 Essential (primary) hypertension: Secondary | ICD-10-CM

## 2024-02-07 DIAGNOSIS — I2583 Coronary atherosclerosis due to lipid rich plaque: Secondary | ICD-10-CM

## 2024-02-07 DIAGNOSIS — I2510 Atherosclerotic heart disease of native coronary artery without angina pectoris: Secondary | ICD-10-CM

## 2024-02-07 DIAGNOSIS — Z79899 Other long term (current) drug therapy: Secondary | ICD-10-CM

## 2024-02-07 DIAGNOSIS — E785 Hyperlipidemia, unspecified: Secondary | ICD-10-CM

## 2024-02-07 NOTE — Patient Instructions (Signed)
 Medication Instructions:  The current medical regimen is effective;  continue present plan and medications.  *If you need a refill on your cardiac medications before your next appointment, please call your pharmacy*  Lab Work: Please return for blood work (CBC, BMP and Lipid profile).  You may have this completed at your closest [place].  If you have labs (blood work) drawn today and your tests are completely normal, you will receive your results only by: MyChart Message (if you have MyChart) OR A paper copy in the mail If you have any lab test that is abnormal or we need to change your treatment, we will call you to review the results.  Follow-Up: At [facility], you and your health needs are our priority.  As part of our continuing mission to provide you with exceptional heart care, our providers are all part of one team.  This team includes your primary Cardiologist (physician) and Advanced Practice Providers or APPs (Physician Assistants and Nurse Practitioners) who all work together to provide you with the care you need, when you need it.  Your next appointment:   1 year(s)  Provider:   Charles Connor, NP      Then, Dorothye Gathers, MD will plan to see you again in 2 year(s).    We recommend signing up for the patient portal called "MyChart".  Sign up information is provided on this After Visit Summary.  MyChart is used to connect with patients for Virtual Visits (Telemedicine).  Patients are able to view lab/test results, encounter notes, upcoming appointments, etc.  Non-urgent messages can be sent to your provider as well.   To learn more about what you can do with MyChart, go to [email or web address].

## 2024-02-07 NOTE — Progress Notes (Signed)
 Cardiology Office Note:  .   Date:  02/07/2024  ID:  Matthew Walls, DOB Dec 29, 1955, MRN [id number] PCP: Otis Blocker, PA  [facility] Providers Cardiologist:  Dorothye Gathers, MD    History of Present Illness: .   Zackeriah Shatzer is a 68 y.o. male Discussed the use of AI scribe software for clinical note transcription with the patient, who gave verbal consent to proceed.  History of Present Illness Gustave Mcfaul "Siegfried Dress" is a 68 year old male with coronary artery disease post-myocardial infarction 2016 who presents for follow-up.  He has a history of coronary artery disease and suffered a myocardial infarction in 2016, which was treated with a drug-eluting stent to the circumflex artery. Since the stent placement, he has experienced no recurrence of chest pain or shortness of breath.  He is currently on a medication regimen that includes aspirin  81 mg daily, atorvastatin  40 mg daily, carvedilol  3.125 mg twice daily, and amlodipine  10 mg daily, and he adheres to this regimen without any issues. He mentions that he was previously on a medication starting with 'I' which was discontinued some time ago.  He maintains an active lifestyle, walking for 45 minutes in the morning, and I promoted a Mediterranean diet. No symptoms of concern such as premature atrial contractions, which were noted but not felt by him.      ROS: No syncope  Studies Reviewed: Aaron Aas    EKG Interpretation Date/Time:  Thursday Feb 07 2024 08:25:48 EDT Ventricular Rate:  85 PR Interval:  138 QRS Duration:  84 QT Interval:  362 QTC Calculation: 430 R Axis:   64  Text Interpretation: Sinus rhythm with Premature atrial complexes When compared with ECG of 09-Jul-2019 19:07,  PVC are no longer present. PAC's are Confirmed by Dorothye Gathers ([id number]) on 02/07/2024 9:04:49 AM    Results DIAGNOSTIC EKG: Premature atrial contractions (02/07/2024)  Risk Assessment/Calculations:            Physical Exam:   VS:  BP 130/78    Pulse 85   Ht 5' 9.5" (1.765 m)   SpO2 96%   BMI 40.90 kg/m    Wt Readings from Last 3 Encounters:  07/13/22 281 lb (127.5 kg)  08/23/20 195 lb (88.5 kg)  07/09/19 265 lb (120.2 kg)    GEN: Well nourished, well developed in no acute distress NECK: No JVD; No carotid bruits CARDIAC: RRR, no murmurs, no rubs, no gallops RESPIRATORY:  Clear to auscultation without rales, wheezing or rhonchi  ABDOMEN: Soft, non-tender, non-distended EXTREMITIES:  No edema; No deformity   ASSESSMENT AND PLAN: .    Assessment and Plan Assessment & Plan Coronary artery disease, post-MI with stent Coronary artery disease post-myocardial infarction in 2016, treated with a drug-eluting stent in the circumflex artery. He reports no chest pain or dyspnea since stent placement. Current medications include aspirin , atorvastatin , carvedilol , and amlodipine  to prevent future cardiac events and manage hypertension. Aspirin  reduces thrombotic risk, while atorvastatin  stabilizes plaque. - Continue aspirin  81 mg daily - Continue atorvastatin  40 mg daily - Continue carvedilol  3.125 mg twice daily - Continue amlodipine  10 mg daily - Encourage Mediterranean diet and regular physical activity to assist with morbid obesity  Hypertension Hypertension is well-managed with carvedilol  and amlodipine . - Continue carvedilol  3.125 mg twice daily - Continue amlodipine  10 mg daily  Hyperlipidemia Hyperlipidemia is managed with atorvastatin . Goal LDL is less than 70 mg/dL. Recent LDL levels are unavailable, necessitating lab work. - Order lipid panel - Continue atorvastatin  40  mg daily  Premature atrial contractions EKG shows premature atrial contractions, which are benign and do not require immediate intervention. Carvedilol  may aid in management. - Continue carvedilol  3.125 mg twice daily  Follow-up Follow-up plan includes alternating visits between the cardiologist and an advanced practice provider (APP). - Schedule  follow-up with APP in one year - Schedule follow-up with cardiologist in two years - Order CBC and basic metabolic panel - Advise fasting before lab work          Signed, Dorothye Gathers, MD

## 2024-02-18 ENCOUNTER — Other Ambulatory Visit: Payer: Self-pay | Admitting: Cardiology

## 2024-02-19 ENCOUNTER — Telehealth: Payer: Self-pay | Admitting: Cardiology

## 2024-02-19 MED ORDER — AMLODIPINE BESYLATE 10 MG PO TABS: 10.0000 mg | ORAL_TABLET | Freq: Every day | ORAL | 3 refills | Status: AC

## 2024-02-19 MED ORDER — CARVEDILOL 3.125 MG PO TABS: 3.1250 mg | ORAL_TABLET | Freq: Two times a day (BID) | ORAL | 3 refills | Status: AC

## 2024-02-19 MED ORDER — ATORVASTATIN CALCIUM 40 MG PO TABS: 40.0000 mg | ORAL_TABLET | Freq: Every day | ORAL | 3 refills | Status: AC

## 2024-02-19 NOTE — Telephone Encounter (Signed)
*  STAT* If patient is at the pharmacy, call can be transferred to refill team.   1. Which medications need to be refilled? (please list name of each medication and dose if known)  amLODipine  (NORVASC ) 10 MG tablet  atorvastatin  (LIPITOR ) 40 MG tablet  carvedilol  (COREG ) 3.125 MG tablet   2. Which pharmacy/location (including street and city if local pharmacy) is medication to be sent to? [id number] - [place], [place] - [address]    3. Do they need a 30 day or 90 day supply?  90 day supply

## 2024-02-19 NOTE — Telephone Encounter (Signed)
 Pt's medications were sent to pt's pharmacy as requested. Confirmation received.
